# Patient Record
Sex: Female | Born: 1980 | Race: White | Hispanic: No | Marital: Married | Smoking: Current every day smoker
Health system: Southern US, Community
[De-identification: ages and names within clinical notes are randomized; demographics above are authoritative.]

## PROBLEM LIST (undated history)

## (undated) ENCOUNTER — Inpatient Hospital Stay (HOSPITAL_COMMUNITY): Payer: Self-pay

## (undated) DIAGNOSIS — R112 Nausea with vomiting, unspecified: Secondary | ICD-10-CM

## (undated) DIAGNOSIS — Z62810 Personal history of physical and sexual abuse in childhood: Secondary | ICD-10-CM

## (undated) DIAGNOSIS — F329 Major depressive disorder, single episode, unspecified: Secondary | ICD-10-CM

## (undated) DIAGNOSIS — Z9889 Other specified postprocedural states: Secondary | ICD-10-CM

## (undated) DIAGNOSIS — F17200 Nicotine dependence, unspecified, uncomplicated: Secondary | ICD-10-CM

## (undated) DIAGNOSIS — Z8719 Personal history of other diseases of the digestive system: Secondary | ICD-10-CM

## (undated) DIAGNOSIS — F32A Depression, unspecified: Secondary | ICD-10-CM

## (undated) DIAGNOSIS — F419 Anxiety disorder, unspecified: Secondary | ICD-10-CM

## (undated) DIAGNOSIS — K5090 Crohn's disease, unspecified, without complications: Secondary | ICD-10-CM

## (undated) DIAGNOSIS — K589 Irritable bowel syndrome without diarrhea: Secondary | ICD-10-CM

## (undated) DIAGNOSIS — K219 Gastro-esophageal reflux disease without esophagitis: Secondary | ICD-10-CM

## (undated) HISTORY — PX: UPPER GI ENDOSCOPY: SHX6162

## (undated) HISTORY — PX: DIAGNOSTIC LAPAROSCOPY WITH REMOVAL OF ECTOPIC PREGNANCY: SHX6449

## (undated) HISTORY — PX: COLONOSCOPY: SHX174

## (undated) HISTORY — PX: TONSILLECTOMY: SUR1361

---

## 2010-04-30 ENCOUNTER — Emergency Department (HOSPITAL_BASED_OUTPATIENT_CLINIC_OR_DEPARTMENT_OTHER): Admission: EM | Admit: 2010-04-30 | Discharge: 2010-04-30 | Payer: Self-pay | Admitting: Emergency Medicine

## 2010-04-30 ENCOUNTER — Ambulatory Visit: Payer: Self-pay | Admitting: Radiology

## 2010-05-22 ENCOUNTER — Emergency Department (HOSPITAL_COMMUNITY): Admission: EM | Admit: 2010-05-22 | Discharge: 2010-05-22 | Payer: Self-pay | Admitting: Emergency Medicine

## 2010-05-22 ENCOUNTER — Encounter: Payer: Self-pay | Admitting: Orthopedic Surgery

## 2010-05-27 ENCOUNTER — Ambulatory Visit: Payer: Self-pay | Admitting: Orthopedic Surgery

## 2010-05-27 DIAGNOSIS — S9030XA Contusion of unspecified foot, initial encounter: Secondary | ICD-10-CM | POA: Insufficient documentation

## 2010-06-06 ENCOUNTER — Emergency Department (HOSPITAL_BASED_OUTPATIENT_CLINIC_OR_DEPARTMENT_OTHER): Admission: EM | Admit: 2010-06-06 | Discharge: 2010-06-06 | Payer: Self-pay | Admitting: Emergency Medicine

## 2010-06-07 ENCOUNTER — Emergency Department (HOSPITAL_COMMUNITY): Admission: EM | Admit: 2010-06-07 | Discharge: 2010-06-07 | Payer: Self-pay | Admitting: Emergency Medicine

## 2010-07-15 ENCOUNTER — Encounter: Payer: Self-pay | Admitting: Orthopedic Surgery

## 2010-09-16 ENCOUNTER — Ambulatory Visit: Payer: Self-pay | Admitting: Obstetrics and Gynecology

## 2010-09-16 ENCOUNTER — Inpatient Hospital Stay (HOSPITAL_COMMUNITY): Admission: AD | Admit: 2010-09-16 | Discharge: 2010-09-16 | Payer: Self-pay | Admitting: Obstetrics & Gynecology

## 2011-01-21 NOTE — Letter (Signed)
Summary: *Orthopedic No Show Letter  [facility]  [address]  [place], [place] [address]   Phone: [phone]  Fax: [phone]     07/15/2010   Hephzibah Strehle [address] [place], [place]   [address]    Dear Ms. CASEY,   Our records indicate that you missed your scheduled appointment with Dr. Beaulah Corin on _7/21/2011_.  Please contact this office to reschedule your appointment as soon as possible.  It is important that you keep your scheduled appointments with your physician, so we can provide you the best care possible.        Sincerely,    Dr. Terrance Mass, MD [facility] Phone [phone]

## 2011-01-21 NOTE — Letter (Signed)
Summary: History form  History form   Imported By: Jacklynn Ganong 05/29/2010 09:07:57  _____________________________________________________________________  External Attachment:    Type:   Image     Comment:   External Document

## 2011-01-21 NOTE — Assessment & Plan Note (Signed)
Summary: [facility] ER FOL/UP/RT HEEL INJURY/XRAY THERE 05/22/10/[facility]   Vital Signs:  Patient profile:   30 year old female Height:      63 inches Weight:      121 pounds Pulse rate:   70 / minute Resp:     16 per minute  Vitals Entered By: Fuller Canada MD (May 27, 2010 1:49 PM)  Visit Type:  new patient Referring Provider:  ap er Primary Provider:  na  CC:  right foot pain.  History of Present Illness: I saw Mary Lowe in the office today for an initial visit.  She is a 30 years old woman with the complaint of:  right foot pain.  The patient reports jumping into a shallow pulley of on May 30 complaint of pain in her RIGHT heel presented to the emergency room according to the notes 2 days later with no bruising or ecchymosis or swelling just tenderness in the heel and difficulty weightbearing  Complaint of sharp throbbing 9/10 constant pain worse with walking and putting on her shoes  X-rays are negative  She is on Percocet and Reglan complains of nausea; Reglan is not working, also taking ibuprofen 800 mg but appears to be taking the Percocet every 6-8 hours and alternating with ibuprofen.        Allergies (verified): No Known Drug Allergies  Past History:  Past Medical History: na  Past Surgical History: na  Family History: FH of Cancer:   Social History: Patient is divorced.  unemployed smokes 1/2 ppd no alcohol no caffeine GED  Review of Systems Constitutional:  Complains of chills and fatigue; denies weight loss, weight gain, and fever. Cardiovascular:  Complains of fainting; denies chest pain, palpitations, and murmurs. Respiratory:  Denies short of breath, wheezing, couch, tightness, pain on inspiration, and snoring . Gastrointestinal:  Complains of nausea and vomiting; denies heartburn, diarrhea, constipation, and blood in your stools. Genitourinary:  Denies frequency, urgency, difficulty urinating, painful urination, flank pain, and bleeding in  urine. Neurologic:  Complains of tingling and dizziness; denies numbness, unsteady gait, tremors, and seizure. Musculoskeletal:  Denies joint pain, swelling, instability, stiffness, redness, heat, and muscle pain. Endocrine:  Complains of excessive thirst; denies exessive urination and heat or cold intolerance. Psychiatric:  Denies nervousness, depression, anxiety, and hallucinations. Skin:  Denies changes in the skin, poor healing, rash, itching, and redness. HEENT:  Denies blurred or double vision, eye pain, redness, and watering. Immunology:  Denies seasonal allergies, sinus problems, and allergic to bee stings. Hemoatologic:  Denies easy bleeding and brusing.  Physical Exam  Skin:  intact without lesions or rashes Inguinal Nodes:  no significant adenopathy Psych:  alert and cooperative; normal mood and affect; normal attention span and concentration   Foot/Ankle Exam  General:    Well-developed, well-nourished ,normal body habitus; no deformities, normal grooming.    Gait:    antalgic.    Skin:    Intact with no scars, lesions, rashes, cafe-au-lait spots or bruising.    Inspection:    Inspection reveals no deformity, ecchymosis or swelling.   Palpation:    over the base of the heeltenderness R-hindfoot:   Vascular:    dorsalis pedis and posterior tibial pulses 2+ and symmetric, capillary refill < 2 seconds, normal hair pattern, no evidence of ischemia.   Sensory:    gross sensation intact bilaterally in lower extremities.    Motor:    Motor strength 5/5 bilaterally for ankle dorsiflexion, ankle plantar flexion, ankle inversion and ankle eversion.  Her Mary Lowe test is negative for Achilles tendon rupture  Reflexes:    Normal and symmetric Achilles reflexes bilaterally.    Ankle Exam:    Right:    Inspection:  Normal    Palpation:  Normal    she does not have any bruising or ecchymosis questionable secondary gain    Left:    Inspection:  Normal     Palpation:  Normal   Impression & Recommendations:  Problem # 1:  CONTUSION, RIGHT FOOT (ICD-924.20) Assessment New  RIGHT contusion no fracture seen on x-ray  Orders: New Patient Level III ([address])  Medications Added to Medication List This Visit: 1)  Promethazine Hcl 25 Mg Tabs (Promethazine hcl) .Marland Kitchen.. 1 q 4 hrs 2)  Percocet 5-325 Mg Tabs (Oxycodone-acetaminophen) .Marland Kitchen.. 1 q 4 prn pain  Patient Instructions: 1)  Stay off your feet use the crutches until the pain improves  2)  Take percocet 5 mg q 4 hours  3)  Take Ibuprofen q 8 hrs 4)  recheck in 4 weeks   Prescriptions: PERCOCET 5-325 MG TABS (OXYCODONE-ACETAMINOPHEN) 1 q 4 prn pain  #84 x 0   Entered and Authorized by:   Fuller Canada MD   Signed by:   Fuller Canada MD on 05/27/2010   Method used:   Print then Give to Patient   RxID:   [id number] PROMETHAZINE HCL 25 MG TABS (PROMETHAZINE HCL) 1 q 4 hrs  #84 x 2   Entered and Authorized by:   Fuller Canada MD   Signed by:   Fuller Canada MD on 05/27/2010   Method used:   Print then Give to Patient   RxID:   [id number]

## 2011-03-06 LAB — GC/CHLAMYDIA PROBE AMP, GENITAL
Chlamydia, DNA Probe: NEGATIVE
GC Probe Amp, Genital: NEGATIVE

## 2011-03-06 LAB — URINALYSIS, ROUTINE W REFLEX MICROSCOPIC
Hgb urine dipstick: NEGATIVE
Protein, ur: NEGATIVE mg/dL
Urobilinogen, UA: 0.2 mg/dL (ref 0.0–1.0)

## 2011-03-06 LAB — CBC
RBC: 4.33 MIL/uL (ref 3.87–5.11)
RDW: 13.5 % (ref 11.5–15.5)

## 2011-03-06 LAB — WET PREP, GENITAL
Clue Cells Wet Prep HPF POC: NONE SEEN
Trich, Wet Prep: NONE SEEN

## 2011-03-06 LAB — POCT PREGNANCY, URINE: Preg Test, Ur: POSITIVE

## 2011-03-09 LAB — URINALYSIS, ROUTINE W REFLEX MICROSCOPIC
Bilirubin Urine: NEGATIVE
Glucose, UA: NEGATIVE mg/dL
Protein, ur: NEGATIVE mg/dL
Specific Gravity, Urine: 1.024 (ref 1.005–1.030)

## 2011-03-09 LAB — PREGNANCY, URINE: Preg Test, Ur: NEGATIVE

## 2011-03-11 LAB — COMPREHENSIVE METABOLIC PANEL
ALT: 16 U/L (ref 0–35)
BUN: 12 mg/dL (ref 6–23)
Calcium: 9.3 mg/dL (ref 8.4–10.5)
Chloride: 108 mEq/L (ref 96–112)
GFR calc Af Amer: >60 mL/min (ref >60)
Potassium: 4.4 mEq/L (ref 3.5–5.1)
Sodium: 144 mEq/L (ref 135–145)

## 2011-03-11 LAB — CBC
HCT: 40.7 % (ref 36.0–46.0)
MCHC: 33.9 g/dL (ref 30.0–36.0)
MCV: 84.1 fL (ref 78.0–100.0)
Platelets: 220 10*3/uL (ref 150–400)
RBC: 4.84 MIL/uL (ref 3.87–5.11)
RDW: 14.8 % (ref 11.5–15.5)
WBC: 9 10*3/uL (ref 4.0–10.5)

## 2011-03-11 LAB — URINALYSIS, ROUTINE W REFLEX MICROSCOPIC
Hgb urine dipstick: NEGATIVE
Protein, ur: NEGATIVE mg/dL

## 2011-03-11 LAB — DIFFERENTIAL
Basophils Absolute: 0.1 10*3/uL (ref 0.0–0.1)
Eosinophils Absolute: 0.1 10*3/uL (ref 0.0–0.7)
Eosinophils Relative: 1 % (ref 0–5)
Lymphocytes Relative: 22 % (ref 12–46)
Lymphs Abs: 2 10*3/uL (ref 0.7–4.0)

## 2011-07-14 ENCOUNTER — Inpatient Hospital Stay (HOSPITAL_COMMUNITY): Payer: 59

## 2011-07-14 ENCOUNTER — Other Ambulatory Visit: Payer: Self-pay | Admitting: Advanced Practice Midwife

## 2011-07-14 ENCOUNTER — Emergency Department (HOSPITAL_COMMUNITY)
Admission: EM | Admit: 2011-07-14 | Discharge: 2011-07-14 | Disposition: A | Discharge status: Home / Self Care | Payer: 59 | Attending: Emergency Medicine | Admitting: Emergency Medicine

## 2011-07-14 ENCOUNTER — Encounter (HOSPITAL_COMMUNITY): Payer: Self-pay

## 2011-07-14 ENCOUNTER — Emergency Department (HOSPITAL_COMMUNITY): Payer: 59

## 2011-07-14 ENCOUNTER — Inpatient Hospital Stay (EMERGENCY_DEPARTMENT_HOSPITAL)
Admission: AD | Admit: 2011-07-14 | Discharge: 2011-07-14 | Disposition: A | Discharge status: Still In Hospital | Payer: 59 | Source: Ambulatory Visit | Attending: Obstetrics & Gynecology | Admitting: Obstetrics & Gynecology

## 2011-07-14 DIAGNOSIS — Q619 Cystic kidney disease, unspecified: Secondary | ICD-10-CM | POA: Insufficient documentation

## 2011-07-14 DIAGNOSIS — Z3201 Encounter for pregnancy test, result positive: Secondary | ICD-10-CM

## 2011-07-14 DIAGNOSIS — O4690 Antepartum hemorrhage, unspecified, unspecified trimester: Secondary | ICD-10-CM

## 2011-07-14 DIAGNOSIS — O99330 Smoking (tobacco) complicating pregnancy, unspecified trimester: Secondary | ICD-10-CM | POA: Insufficient documentation

## 2011-07-14 DIAGNOSIS — R1031 Right lower quadrant pain: Secondary | ICD-10-CM | POA: Insufficient documentation

## 2011-07-14 DIAGNOSIS — K589 Irritable bowel syndrome without diarrhea: Secondary | ICD-10-CM | POA: Insufficient documentation

## 2011-07-14 DIAGNOSIS — O26899 Other specified pregnancy related conditions, unspecified trimester: Secondary | ICD-10-CM | POA: Diagnosis present

## 2011-07-14 DIAGNOSIS — R109 Unspecified abdominal pain: Secondary | ICD-10-CM | POA: Diagnosis present

## 2011-07-14 DIAGNOSIS — O2690 Pregnancy related conditions, unspecified, unspecified trimester: Secondary | ICD-10-CM | POA: Insufficient documentation

## 2011-07-14 DIAGNOSIS — R51 Headache: Secondary | ICD-10-CM | POA: Insufficient documentation

## 2011-07-14 DIAGNOSIS — O34519 Maternal care for incarceration of gravid uterus, unspecified trimester: Secondary | ICD-10-CM | POA: Insufficient documentation

## 2011-07-14 DIAGNOSIS — O9989 Other specified diseases and conditions complicating pregnancy, childbirth and the puerperium: Secondary | ICD-10-CM

## 2011-07-14 LAB — URINALYSIS, ROUTINE W REFLEX MICROSCOPIC
Hgb urine dipstick: NEGATIVE
Ketones, ur: NEGATIVE mg/dL
Leukocytes, UA: NEGATIVE
Protein, ur: NEGATIVE mg/dL
Urobilinogen, UA: 0.2 mg/dL (ref 0.0–1.0)
pH: 5.5 (ref 5.0–8.0)

## 2011-07-14 LAB — HCG, QUANTITATIVE, PREGNANCY: hCG, Beta Chain, Quant, S: 661 m[IU]/mL — ABNORMAL HIGH (ref <5)

## 2011-07-14 LAB — CBC
Hemoglobin: 13.8 g/dL (ref 12.0–15.0)
MCH: 29.7 pg (ref 26.0–34.0)
WBC: 10.7 10*3/uL — ABNORMAL HIGH (ref 4.0–10.5)

## 2011-07-14 LAB — ABO/RH: ABO/RH(D): A NEG

## 2011-07-14 MED ORDER — PROMETHAZINE HCL 25 MG PO TABS
25.0000 mg | ORAL_TABLET | Freq: Once | ORAL | Status: AC
  Administered 2011-07-14: 25 mg via ORAL
  Filled 2011-07-14: qty 1

## 2011-07-14 MED ORDER — HYDROMORPHONE HCL 1 MG/ML IJ SOLN
2.0000 mg | Freq: Once | INTRAMUSCULAR | Status: AC
  Administered 2011-07-14: 2 mg via INTRAVENOUS
  Filled 2011-07-14: qty 2

## 2011-07-14 MED ORDER — ONDANSETRON 8 MG PO TBDP: 8.0000 mg | ORAL_TABLET | Freq: Once | ORAL | Status: DC

## 2011-07-14 MED ORDER — ONDANSETRON HCL 4 MG PO TABS
8.0000 mg | ORAL_TABLET | Freq: Once | ORAL | Status: AC
  Administered 2011-07-14: 8 mg via ORAL
  Filled 2011-07-14: qty 2

## 2011-07-14 MED ORDER — OXYCODONE-ACETAMINOPHEN 5-325 MG PO TABS
2.0000 | ORAL_TABLET | Freq: Once | ORAL | Status: AC
  Administered 2011-07-14: 2 via ORAL
  Filled 2011-07-14: qty 2

## 2011-07-14 MED ORDER — SODIUM CHLORIDE 0.9 % IV SOLN
INTRAVENOUS | Status: DC
  Administered 2011-07-14: 18:00:00 via INTRAVENOUS

## 2011-07-14 NOTE — Progress Notes (Signed)
Pt in c/o pain in lower right quadrant sharp pains x3 days.  States pain is constant and has gotten worse since pelvic exam performed last night.  Was seen at [facility]. Center, had BHCG- 487, had wet prep/gc/chlamydia, and u/s- nothing seen on u/s.  LMP was 05/09/11.  Denies any bleeding.  Reports heavy bleeding 2 weeks during/after intercourse- had a lot of cramping with bleeding- states she did pass clots.

## 2011-07-14 NOTE — ED Provider Notes (Signed)
History   The pt is a 30 year-old G4P2012 at 9.3 weeks by LMP who presents to MAU reporting severe RLQ pain. She was seen at [facility] last night for the same problem. Quant hCG was 487. US showed no GS or adnexal masses. The pt experiences and increase in pain since then and came to MAU for further eval. She had one episode of bleeding two weeks ago post-IC w/ no bleeding since, but did not seek care at that time. She is Rh neg and did not receive Rhophylac.  Chief Complaint  Patient presents with  . Abdominal Pain   Abdominal Pain This is a new problem. The current episode started yesterday. The onset quality is gradual. The problem occurs constantly. The most recent episode lasted 2 days. The problem has been gradually worsening. The pain is located in the RLQ. The pain is at a severity of 10/10. The pain is severe. The quality of the pain is sharp. The abdominal pain does not radiate. Associated symptoms include headaches and nausea. Pertinent negatives include no constipation, diarrhea, dysuria, frequency, hematuria, myalgias or vomiting.    OB History    Grav Para Term Preterm Abortions TAB SAB Ect Mult Living   4 2 2  0 1 0 1 0 0 2      Past Medical History  Diagnosis Date  . No pertinent past medical history     Past Surgical History  Procedure Date  . No past surgeries     No family history on file.  History  Substance Use Topics  . Smoking status: Current Everyday Smoker  . Smokeless tobacco: Not on file  . Alcohol Use: No    Allergies:  Allergies  Allergen Reactions  . Sulfa Antibiotics Hives    Prescriptions prior to admission  Medication Sig Dispense Refill  . acetaminophen (TYLENOL) 325 MG tablet Take 650 mg by mouth every 6 (six) hours as needed. PATIENT TAKES FOR PAIN      . famotidine ([employer] ACID REDUCER) 10 MG tablet Take 10 mg by mouth daily as needed. PATIENT TAKES FOR ACID REFLUX       . prenatal vitamin w/FE, FA (PRENATAL 1 + 1) 27-1 MG TABS Take 1  tablet by mouth daily.          Review of Systems  Constitutional: Negative.        Loss of appetite since this morning  Gastrointestinal: Positive for nausea and abdominal pain. Negative for vomiting, diarrhea, constipation and blood in stool.  Genitourinary: Negative for dysuria, urgency, frequency, hematuria and flank pain.  Musculoskeletal: Negative for myalgias.  Neurological: Positive for headaches.  All other systems reviewed and are negative.   Physical Exam   Results for orders placed during the hospital encounter of 07/14/11 (from the past 24 hour(s))  PREGNANCY, URINE     Status: Normal   Collection Time   07/14/11 12:13 PM      Component Value Range   Preg Test, Ur POSITIVE    URINALYSIS, ROUTINE W REFLEX MICROSCOPIC     Status: Normal   Collection Time   07/14/11 12:13 PM      Component Value Range   Color, Urine YELLOW  YELLOW    Appearance CLEAR  CLEAR    Specific Gravity, Urine 1.025  1.005 - 1.030    pH 5.5  5.0 - 8.0    Glucose, UA NEGATIVE  NEGATIVE (mg/dL)   Hgb urine dipstick NEGATIVE  NEGATIVE    Bilirubin Urine  NEGATIVE  NEGATIVE    Ketones, ur NEGATIVE  NEGATIVE (mg/dL)   Protein, ur NEGATIVE  NEGATIVE (mg/dL)   Urobilinogen, UA 0.2  0.0 - 1.0 (mg/dL)   Nitrite NEGATIVE  NEGATIVE    Leukocytes, UA NEGATIVE  NEGATIVE   POCT PREGNANCY, URINE     Status: Normal   Collection Time   07/14/11 12:22 PM      Component Value Range   Preg Test, Ur POSITIVE    HCG, QUANTITATIVE, PREGNANCY     Status: Abnormal   Collection Time   07/14/11 12:48 PM      Component Value Range   hCG, Beta Chain, Quant, S 661 (*) <5 (mIU/mL)  CBC     Status: Abnormal   Collection Time   07/14/11 12:48 PM      Component Value Range   WBC 10.7 (*) 4.0 - 10.5 (K/uL)   RBC 4.64  3.87 - 5.11 (MIL/uL)   Hemoglobin 13.8  12.0 - 15.0 (g/dL)   HCT 04.5  40.9 - 81.1 (%)   MCV 87.7  78.0 - 100.0 (fL)   MCH 29.7  26.0 - 34.0 (pg)   MCHC 33.9  30.0 - 36.0 (g/dL)   RDW 91.4  78.2 - 95.6  (%)   Platelets 180  150 - 400 (K/uL)  ABO/RH     Status: Normal   Collection Time   07/14/11 12:48 PM      Component Value Range   ABO/RH(D) A NEG      Blood pressure 121/70, pulse 93, temperature 99.1 F (37.3 C), temperature source Oral, resp. rate 16, height 5\' 3"  (1.6 m), weight 60.895 kg (134 lb 4 oz), last menstrual period 05/09/2011.  Physical Exam  Constitutional: She is oriented to person, place, and time. She appears well-developed and well-nourished. She appears distressed.  Cardiovascular: Normal rate and regular rhythm.   Respiratory: Effort normal.  GI: Soft. Bowel sounds are normal. She exhibits no mass. There is tenderness (RLQ). There is rebound (RLQ) and guarding (RLQ).  Genitourinary: Vagina normal and uterus normal. No vaginal discharge found.  Musculoskeletal: Normal range of motion.  Neurological: She is alert and oriented to person, place, and time.  Skin: Skin is warm and dry. She is not diaphoretic.  Psychiatric: She has a normal mood and affect.    MAU Course  Procedures  Assessment and Plan  Assessment: 1. R/O appendicitis vs ectopic 2. Early pregnancy, rising quants, but interval too short to determine is rising appropriately  Plan: 1. Per consult w/ Dr. Debroah Loop abd and pelvis US   Aniko Finnigan 07/14/2011, 5:13 PM

## 2011-07-14 NOTE — ED Provider Notes (Signed)
*  RADIOLOGY REPORT*  Clinical Data: Positive pregnancy test, right lower quadrant  abdominal pain. Clinical concern for appendicitis.  LIMITED ABDOMINAL ULTRASOUND  Comparison: None.  Findings: Evaluation of the right lower quadrant does not  demonstrate an anatomic structure identifiable as the appendix.  IMPRESSION:  Appendix not identified. If there is high clinical suspicion for  appendicitis, MRI of the abdomen and pelvis without contrast would  be recommended for further evaluation.  Original Report Authenticated By: Harrel Lemon, M.D.  OB US <14 weeks No GS, YS, FP or cardiac activity Left simple cysts2.3 x 1.7x 2.1, 1.8x0.9x 1.3 cm   Per consult w/ Dr. Debroah Loop transfer to [facility] for MRI w/out contrast. IV Dilaudid 2 mg and Phenergan

## 2011-07-14 NOTE — Progress Notes (Signed)
Pt states had +upt last week at home, went to ED in [place] last pm, told she has cyst on her ovary, and hcg level was 487. LMP-05/09/2011, denies vag d/c changes or bleeding. GC/CHL, wet prep completed at ED last pm. Lower abd pain into her vagina, rates 9-10, has worsened in past 2-3 days, extremely worse post MD exam.

## 2011-07-15 ENCOUNTER — Telehealth: Payer: Self-pay | Admitting: Advanced Practice Midwife

## 2011-07-16 ENCOUNTER — Ambulatory Visit (HOSPITAL_COMMUNITY): Payer: 59

## 2011-07-18 ENCOUNTER — Inpatient Hospital Stay (HOSPITAL_COMMUNITY): Payer: 59

## 2011-07-18 ENCOUNTER — Other Ambulatory Visit (HOSPITAL_COMMUNITY): Payer: Self-pay | Admitting: *Deleted

## 2011-07-18 ENCOUNTER — Other Ambulatory Visit: Payer: 59

## 2011-07-18 ENCOUNTER — Inpatient Hospital Stay (HOSPITAL_COMMUNITY)
Admission: AD | Admit: 2011-07-18 | Discharge: 2011-07-18 | Disposition: A | Discharge status: Home / Self Care | Payer: 59 | Source: Ambulatory Visit | Attending: Obstetrics & Gynecology | Admitting: Obstetrics & Gynecology

## 2011-07-18 ENCOUNTER — Encounter (HOSPITAL_COMMUNITY): Payer: Self-pay | Admitting: *Deleted

## 2011-07-18 DIAGNOSIS — O26899 Other specified pregnancy related conditions, unspecified trimester: Secondary | ICD-10-CM

## 2011-07-18 DIAGNOSIS — O9989 Other specified diseases and conditions complicating pregnancy, childbirth and the puerperium: Secondary | ICD-10-CM | POA: Insufficient documentation

## 2011-07-18 DIAGNOSIS — Z1389 Encounter for screening for other disorder: Secondary | ICD-10-CM | POA: Insufficient documentation

## 2011-07-18 DIAGNOSIS — Z3490 Encounter for supervision of normal pregnancy, unspecified, unspecified trimester: Secondary | ICD-10-CM

## 2011-07-18 DIAGNOSIS — R109 Unspecified abdominal pain: Secondary | ICD-10-CM

## 2011-07-18 DIAGNOSIS — Z363 Encounter for antenatal screening for malformations: Secondary | ICD-10-CM | POA: Insufficient documentation

## 2011-07-18 DIAGNOSIS — Z8719 Personal history of other diseases of the digestive system: Secondary | ICD-10-CM | POA: Insufficient documentation

## 2011-07-18 LAB — HCG, QUANTITATIVE, PREGNANCY: hCG, Beta Chain, Quant, S: 2818 m[IU]/mL — ABNORMAL HIGH (ref <5)

## 2011-07-18 NOTE — Progress Notes (Signed)
Mary Lowe notified of [place] needing to be transvaginal per [place].  Orders received for transvaginal US.

## 2011-07-18 NOTE — Progress Notes (Signed)
K. Shaw, CNM at bedside.  Assessment done and poc discussed with pt.  

## 2011-07-18 NOTE — Progress Notes (Signed)
Pincus Badder, cnm notified of quant result.  Will be up to see pt.

## 2011-07-18 NOTE — Progress Notes (Signed)
Pt states she has ibs.  States she normally goes to the bathroom 5 times a day.  She hasn't had a bowel movement since Sunday.

## 2011-07-18 NOTE — Progress Notes (Signed)
Pt presents to mau with c/o lower abdominal pain.  States it hurts to walk.  Radiates from right all the way to left and is worse in the middle.

## 2011-07-18 NOTE — Progress Notes (Signed)
Pt reports she was seen at [facility] hospital on Monday , positive preg test, had u/s and MRI. MRI was negative. HCG was 600+. Pt states the pain is getting worse and it hurts to walk. Denies bleeding.

## 2011-07-18 NOTE — ED Provider Notes (Signed)
History   Pt presents for eval of continued low abd pain and for follow up of quant hcg. She was seen on 7/23 with quant of 600 and was also eval by [facility] and appendicitis was ruled out. She hasn't had a BM since 7/22 and has a hx of IBS with freq BMs.   Chief Complaint  Patient presents with  . Abdominal Pain   HPI  OB History    Grav Para Term Preterm Abortions TAB SAB Ect Mult Living   4 2 2  0 1 0 1 0 0 2      Past Medical History  Diagnosis Date  . No pertinent past medical history     Past Surgical History  Procedure Date  . No past surgeries     No family history on file.  History  Substance Use Topics  . Smoking status: Current Everyday Smoker  . Smokeless tobacco: Not on file  . Alcohol Use: No    Allergies:  Allergies  Allergen Reactions  . Sulfa Antibiotics Hives    Prescriptions prior to admission  Medication Sig Dispense Refill  . metoCLOPramide (REGLAN) 10 MG tablet Take 10 mg by mouth every 4 (four) hours as needed. nausea       . oxyCODONE-acetaminophen (PERCOCET) 5-325 MG per tablet Take 1 tablet by mouth every 6 (six) hours as needed. pain       . prenatal vitamin w/FE, FA (PRENATAL 1 + 1) 27-1 MG TABS Take 1 tablet by mouth daily.          Review of Systems  Constitutional: Negative for fever.   Physical Exam   Blood pressure 126/79, pulse 93, temperature 98.4 F (36.9 C), temperature source Oral, resp. rate 18, height 5\' 3"  (1.6 m), weight 62.143 kg (137 lb), last menstrual period 05/09/2011.  Physical Exam  Constitutional: She is oriented to person, place, and time. She appears well-developed.  GI: Soft. There is tenderness. There is no rebound and no guarding.  Neurological: She is alert and oriented to person, place, and time.  Skin: Skin is warm and dry.  Psychiatric: She has a normal mood and affect.    MAU Course  Procedures  MDM Quant 2818; US shows [redacted]w[redacted]d IUS +YS, no cardiac After [place], pt passed gas and now feels much of abd  pain has resolved.  Assessment and Plan  IUP at [redacted]w[redacted]d IBS with constipation/gas  Rec seek prenatal care as soon as possible. Rec use Miralax OTC as directed for constipation.   SHAW,KIMBERLY B 07/18/2011, 2:56 AM

## 2011-08-18 LAB — ANTIBODY SCREEN: Antibody Screen: NEGATIVE

## 2011-08-18 LAB — ABO/RH: RH Type: NEGATIVE

## 2011-08-18 LAB — GC/CHLAMYDIA PROBE AMP, GENITAL
Chlamydia: NEGATIVE
Gonorrhea: NEGATIVE

## 2011-08-18 LAB — HIV ANTIBODY (ROUTINE TESTING W REFLEX): HIV: NONREACTIVE

## 2011-09-24 DIAGNOSIS — O479 False labor, unspecified: Secondary | ICD-10-CM

## 2011-09-24 DIAGNOSIS — R1084 Generalized abdominal pain: Secondary | ICD-10-CM

## 2011-09-24 DIAGNOSIS — N898 Other specified noninflammatory disorders of vagina: Secondary | ICD-10-CM

## 2011-10-31 NOTE — Telephone Encounter (Signed)
Error

## 2011-12-05 ENCOUNTER — Inpatient Hospital Stay (HOSPITAL_COMMUNITY)
Admission: AD | Admit: 2011-12-05 | Discharge: 2011-12-05 | Disposition: A | Discharge status: Home / Self Care | Payer: Medicaid Other | Source: Ambulatory Visit | Attending: Obstetrics and Gynecology | Admitting: Obstetrics and Gynecology

## 2011-12-05 ENCOUNTER — Encounter (HOSPITAL_COMMUNITY): Payer: Self-pay | Admitting: *Deleted

## 2011-12-05 DIAGNOSIS — N898 Other specified noninflammatory disorders of vagina: Secondary | ICD-10-CM | POA: Insufficient documentation

## 2011-12-05 DIAGNOSIS — O99891 Other specified diseases and conditions complicating pregnancy: Secondary | ICD-10-CM | POA: Insufficient documentation

## 2011-12-05 HISTORY — DX: Irritable bowel syndrome, unspecified: K58.9

## 2011-12-05 LAB — WET PREP, GENITAL: Yeast Wet Prep HPF POC: NONE SEEN

## 2011-12-05 LAB — POCT FERN TEST: Fern Test: NEGATIVE

## 2011-12-05 NOTE — ED Provider Notes (Signed)
History     Chief Complaint  Patient presents with  . Vaginal Discharge   HPI  Pt is here with report of leaking of fluid at 1600, clear fluid.  +cramping since Sunday, 11/30/11.  Denies vaginal bleeding.  +fetal movement.    Past Medical History  Diagnosis Date  . No pertinent past medical history   . IBS (irritable bowel syndrome)     Past Surgical History  Procedure Date  . No past surgeries     Family History  Problem Relation Age of Onset  . Depression Mother   . Depression Father   . Heart disease Father     History  Substance Use Topics  . Smoking status: Current Everyday Smoker  . Smokeless tobacco: Not on file  . Alcohol Use: No    Allergies:  Allergies  Allergen Reactions  . Sulfa Antibiotics Hives    Prescriptions prior to admission  Medication Sig Dispense Refill  . metoCLOPramide (REGLAN) 10 MG tablet Take 10 mg by mouth every 4 (four) hours as needed. nausea       . oxyCODONE-acetaminophen (PERCOCET) 5-325 MG per tablet Take 1 tablet by mouth every 6 (six) hours as needed. pain       . prenatal vitamin w/FE, FA (PRENATAL 1 + 1) 27-1 MG TABS Take 1 tablet by mouth daily.          Review of Systems  Gastrointestinal: Positive for abdominal pain (cramping).  Genitourinary:       Vaginal discharge    Physical Exam   Temperature 97.3 F (36.3 C), height 5\' 3"  (1.6 m), weight 68.04 kg (150 lb), last menstrual period 05/09/2011.  Physical Exam  Constitutional: She is oriented to person, place, and time. She appears well-developed and well-nourished.  HENT:  Head: Normocephalic.  Neck: Normal range of motion. Neck supple.  Cardiovascular: Normal rate, regular rhythm and normal heart sounds.   Respiratory: Effort normal and breath sounds normal.  Genitourinary: No bleeding around the vagina. Vaginal discharge (mucusy) found.  Neurological: She is alert and oriented to person, place, and time.  Skin: Skin is warm and dry.  Fern - negative;  cervix closed FHR 150's, 10x10's Toco - none  Fern - negative after waiting one hour MAU Course  Procedures  Results for orders placed during the hospital encounter of 12/05/11 (from the past 24 hour(s))  WET PREP, GENITAL     Status: Abnormal   Collection Time   12/05/11  7:25 PM      Component Value Range   Yeast, Wet Prep NONE SEEN  NONE SEEN    Trich, Wet Prep NONE SEEN  NONE SEEN    Clue Cells, Wet Prep NONE SEEN  NONE SEEN    WBC, Wet Prep HPF POC FEW (*) NONE SEEN   POCT FERN TEST     Status: Normal   Collection Time   12/05/11  7:28 PM      Component Value Range   Fern Test Negative       Assessment and Plan  Vaginal Discharge  Plan: DC to  Home Reviewed labor precautions  [facility] 12/05/2011, 7:18 PM

## 2011-12-05 NOTE — Progress Notes (Signed)
Possible leaking since Monday, today gush fluid then mucus, cramps since Sunday after 16 hr shift

## 2011-12-05 NOTE — Progress Notes (Signed)
Threasa Heads CNM in to see pt. Spec exam done and fern slide and wet prep obtained. Pt tol well.

## 2011-12-05 NOTE — Progress Notes (Signed)
Written and verbal d/c instructions given and understanding voiced. 

## 2011-12-05 NOTE — Progress Notes (Signed)
Last of appetite, stressful work, heavy lifting

## 2011-12-05 NOTE — ED Notes (Signed)
86WTanja Port CNM in to see pt. Fern slide obtained.

## 2011-12-05 NOTE — Consult Note (Signed)
Consulted with Dr. Ellyn Hack, reviewed HPI, exam, fern neg>send wet prep and recheck in an hour.

## 2011-12-23 NOTE — L&D Delivery Note (Signed)
Delivery Note At 6:43 PM a healthy female was delivered via Vaginal, Spontaneous Delivery (Presentation: ; Occiput Anterior).  APGAR: 9, 9; weight 8 lb 10.6 oz (3929 g).   Placenta status: Intact, Spontaneous.  Anesthesia: Epidural  Episiotomy: None Lacerations: 1st degree Suture Repair: 3.0 vicryl rapide Est. Blood Loss (mL): 350cc  Mom to postpartum.  Baby to nursery-stable.  Mary Lowe 03/12/2012, 6:57 PM

## 2012-01-30 ENCOUNTER — Encounter (HOSPITAL_COMMUNITY): Payer: Self-pay | Admitting: *Deleted

## 2012-01-30 ENCOUNTER — Inpatient Hospital Stay (HOSPITAL_COMMUNITY)
Admission: AD | Admit: 2012-01-30 | Discharge: 2012-01-30 | Disposition: A | Discharge status: Home / Self Care | Payer: Medicaid Other | Source: Ambulatory Visit | Attending: Obstetrics and Gynecology | Admitting: Obstetrics and Gynecology

## 2012-01-30 DIAGNOSIS — O4700 False labor before 37 completed weeks of gestation, unspecified trimester: Secondary | ICD-10-CM | POA: Insufficient documentation

## 2012-01-30 DIAGNOSIS — O479 False labor, unspecified: Secondary | ICD-10-CM

## 2012-01-30 LAB — URINALYSIS, ROUTINE W REFLEX MICROSCOPIC
Bilirubin Urine: NEGATIVE
Glucose, UA: NEGATIVE mg/dL
Hgb urine dipstick: NEGATIVE
Ketones, ur: 15 mg/dL — AB
pH: 6 (ref 5.0–8.0)

## 2012-01-30 NOTE — ED Provider Notes (Signed)
History     Chief Complaint  Patient presents with  . Contractions   HPI This is a 31 y.o. G4 P2012 at [redacted]w[redacted]d who presents with c/o UCs off and on all day today. Was seen in office by Dr Ellyn Hack today. Denies leaking or bleeding but did pass some mucous. Feels like baby has dropped. Reports + FM. OB History    Grav Para Term Preterm Abortions TAB SAB Ect Mult Living   4 2 2  0 1 0 1 0 0 2      Past Medical History  Diagnosis Date  . No pertinent past medical history   . IBS (irritable bowel syndrome)     Past Surgical History  Procedure Date  . No past surgeries     Family History  Problem Relation Age of Onset  . Depression Mother   . Depression Father   . Heart disease Father   . Anesthesia problems Neg Hx   . Hypotension Neg Hx   . Malignant hyperthermia Neg Hx   . Pseudochol deficiency Neg Hx     History  Substance Use Topics  . Smoking status: Current Everyday Smoker  . Smokeless tobacco: Not on file  . Alcohol Use: No    Allergies:  Allergies  Allergen Reactions  . Sulfa Antibiotics Hives    Prescriptions prior to admission  Medication Sig Dispense Refill  . omeprazole (PRILOSEC OTC) 20 MG tablet Take 20 mg by mouth daily.      . ondansetron (ZOFRAN) 8 MG tablet Take 8 mg by mouth every 8 (eight) hours as needed. For nausea not relieved by phenergan      . Prenatal Vit-Fe Fumarate-FA (PRENATAL MULTIVITAMIN) TABS Take 1 tablet by mouth daily.      . promethazine (PHENERGAN) 25 MG tablet Take 12.5 mg by mouth every 6 (six) hours as needed. For nausea        ROS As above  Physical Exam   Blood pressure 101/63, pulse 96, temperature 98.8 F (37.1 C), temperature source Oral, resp. rate 16, height 5\' 3"  (1.6 m), weight 156 lb (70.761 kg), last menstrual period 05/09/2011.  Physical Exam  Constitutional: She is oriented to person, place, and time. She appears well-developed and well-nourished.  HENT:  Head: Normocephalic.  Cardiovascular: Normal  rate.   Respiratory: Effort normal.  GI: Soft. She exhibits no distension and no mass. There is no tenderness. There is no rebound and no guarding.  Genitourinary: Uterus normal. Vaginal discharge (clear mucous, cervix FT-1cm/long/ballot/unsure presenting part) found.  Musculoskeletal: Normal range of motion.  Neurological: She is alert and oriented to person, place, and time.  Skin: Skin is warm and dry.  Psychiatric: She has a normal mood and affect.    MAU Course  Procedures  Assessment and Plan  A:  Preterm contractions without significant cervical change P:  Discussed with Dr Ellyn Hack       PO hydration       FFN sent       [facility] 01/30/2012, 10:06 PM   FFN negative UCs have subsided somewhat, with continued irritability. Discussed with Dr Ellyn Hack. Will d/c home with instructions to continue fluid intake and supportive care Followup in office as scheduled.

## 2012-01-30 NOTE — Progress Notes (Signed)
Marie Williams CNM in to discuss d/c plan with pt. Written and verbal d/c instructions given and understanding voiced. 

## 2012-01-30 NOTE — Progress Notes (Signed)
Pt states, " I have had contractions off and on all day and have a lot of pressure like the baby has dropped. I was sitting in the floor at 7:00 pm and when I stood up I felt something run out and I had a sharpa pain in my lower abdomen. I checked and it was bloody and green like my mucus plug."

## 2012-01-30 NOTE — Progress Notes (Signed)
Wynelle Bourgeois CNM in to see pt. FFN obtained and then sve done. Pt tol well

## 2012-03-03 ENCOUNTER — Telehealth (HOSPITAL_COMMUNITY): Payer: Self-pay | Admitting: *Deleted

## 2012-03-03 ENCOUNTER — Encounter (HOSPITAL_COMMUNITY): Payer: Self-pay | Admitting: *Deleted

## 2012-03-03 NOTE — Telephone Encounter (Signed)
Preadmission screen  

## 2012-03-11 ENCOUNTER — Other Ambulatory Visit: Payer: Self-pay | Admitting: Obstetrics and Gynecology

## 2012-03-12 ENCOUNTER — Encounter (HOSPITAL_COMMUNITY): Payer: Self-pay

## 2012-03-12 ENCOUNTER — Inpatient Hospital Stay (HOSPITAL_COMMUNITY): Payer: Medicaid Other | Admitting: Anesthesiology

## 2012-03-12 ENCOUNTER — Inpatient Hospital Stay (HOSPITAL_COMMUNITY)
Admission: RE | Admit: 2012-03-12 | Discharge: 2012-03-14 | DRG: 775 | Disposition: A | Discharge status: Home / Self Care | Payer: Medicaid Other | Source: Ambulatory Visit | Attending: Obstetrics and Gynecology | Admitting: Obstetrics and Gynecology

## 2012-03-12 ENCOUNTER — Encounter (HOSPITAL_COMMUNITY): Payer: Self-pay | Admitting: Anesthesiology

## 2012-03-12 LAB — CBC
Hemoglobin: 12.3 g/dL (ref 12.0–15.0)
MCH: 29.2 pg (ref 26.0–34.0)
MCV: 87.9 fL (ref 78.0–100.0)
RBC: 4.21 MIL/uL (ref 3.87–5.11)

## 2012-03-12 MED ORDER — FENTANYL 2.5 MCG/ML BUPIVACAINE 1/10 % EPIDURAL INFUSION (WH - ANES)
INTRAMUSCULAR | Status: DC | PRN
  Administered 2012-03-12: 14 mL/h via EPIDURAL

## 2012-03-12 MED ORDER — OXYCODONE-ACETAMINOPHEN 5-325 MG PO TABS: 1.0000 | ORAL_TABLET | ORAL | Status: DC | PRN

## 2012-03-12 MED ORDER — DIPHENHYDRAMINE HCL 25 MG PO CAPS: 25.0000 mg | ORAL_CAPSULE | Freq: Four times a day (QID) | ORAL | Status: DC | PRN

## 2012-03-12 MED ORDER — LACTATED RINGERS IV SOLN
INTRAVENOUS | Status: DC
  Administered 2012-03-12 (×3): via INTRAVENOUS

## 2012-03-12 MED ORDER — DIBUCAINE 1 % RE OINT: 1.0000 "application " | TOPICAL_OINTMENT | RECTAL | Status: DC | PRN

## 2012-03-12 MED ORDER — BUTORPHANOL TARTRATE 2 MG/ML IJ SOLN
1.0000 mg | Freq: Once | INTRAMUSCULAR | Status: AC
  Administered 2012-03-12: 1 mg via INTRAVENOUS
  Filled 2012-03-12: qty 1

## 2012-03-12 MED ORDER — SENNOSIDES-DOCUSATE SODIUM 8.6-50 MG PO TABS
2.0000 | ORAL_TABLET | Freq: Every day | ORAL | Status: DC
  Administered 2012-03-12 – 2012-03-13 (×2): 2 via ORAL

## 2012-03-12 MED ORDER — ONDANSETRON HCL 4 MG/2ML IJ SOLN
4.0000 mg | Freq: Once | INTRAMUSCULAR | Status: AC
  Administered 2012-03-12: 4 mg via INTRAVENOUS

## 2012-03-12 MED ORDER — ACETAMINOPHEN 325 MG PO TABS: 650.0000 mg | ORAL_TABLET | ORAL | Status: DC | PRN

## 2012-03-12 MED ORDER — ONDANSETRON HCL 4 MG/2ML IJ SOLN
4.0000 mg | Freq: Four times a day (QID) | INTRAMUSCULAR | Status: DC | PRN
  Administered 2012-03-12: 4 mg via INTRAVENOUS
  Filled 2012-03-12 (×2): qty 2

## 2012-03-12 MED ORDER — PROMETHAZINE HCL 25 MG/ML IJ SOLN
12.5000 mg | Freq: Once | INTRAMUSCULAR | Status: AC
  Administered 2012-03-12: 12.5 mg via INTRAVENOUS
  Filled 2012-03-12: qty 1

## 2012-03-12 MED ORDER — DIPHENHYDRAMINE HCL 50 MG/ML IJ SOLN: 12.5000 mg | INTRAMUSCULAR | Status: DC | PRN

## 2012-03-12 MED ORDER — PHENYLEPHRINE 40 MCG/ML (10ML) SYRINGE FOR IV PUSH (FOR BLOOD PRESSURE SUPPORT): 80.0000 ug | PREFILLED_SYRINGE | INTRAVENOUS | Status: DC | PRN

## 2012-03-12 MED ORDER — WITCH HAZEL-GLYCERIN EX PADS: 1.0000 "application " | MEDICATED_PAD | CUTANEOUS | Status: DC | PRN

## 2012-03-12 MED ORDER — PRENATAL MULTIVITAMIN CH
1.0000 | ORAL_TABLET | Freq: Every day | ORAL | Status: DC
  Administered 2012-03-13 – 2012-03-14 (×2): 1 via ORAL
  Filled 2012-03-12 (×2): qty 1

## 2012-03-12 MED ORDER — OXYTOCIN 20 UNITS IN LACTATED RINGERS INFUSION - SIMPLE
125.0000 mL/h | Freq: Once | INTRAVENOUS | Status: DC
Start: 2012-03-12 — End: 2012-03-12

## 2012-03-12 MED ORDER — BENZOCAINE-MENTHOL 20-0.5 % EX AERO
INHALATION_SPRAY | CUTANEOUS | Status: AC
  Filled 2012-03-12: qty 56

## 2012-03-12 MED ORDER — BUTORPHANOL TARTRATE 2 MG/ML IJ SOLN
1.0000 mg | Freq: Once | INTRAMUSCULAR | Status: AC
  Administered 2012-03-12: 1 mg via INTRAVENOUS

## 2012-03-12 MED ORDER — BUTORPHANOL TARTRATE 2 MG/ML IJ SOLN
INTRAMUSCULAR | Status: AC
  Administered 2012-03-12: 1 mg via INTRAVENOUS
  Filled 2012-03-12: qty 1

## 2012-03-12 MED ORDER — LIDOCAINE HCL (PF) 1 % IJ SOLN
INTRAMUSCULAR | Status: DC | PRN
  Administered 2012-03-12 (×2): 9 mL

## 2012-03-12 MED ORDER — ZOLPIDEM TARTRATE 5 MG PO TABS: 5.0000 mg | ORAL_TABLET | Freq: Every evening | ORAL | Status: DC | PRN

## 2012-03-12 MED ORDER — FENTANYL 2.5 MCG/ML BUPIVACAINE 1/10 % EPIDURAL INFUSION (WH - ANES)
14.0000 mL/h | INTRAMUSCULAR | Status: DC
  Filled 2012-03-12: qty 60

## 2012-03-12 MED ORDER — BENZOCAINE-MENTHOL 20-0.5 % EX AERO
1.0000 "application " | INHALATION_SPRAY | CUTANEOUS | Status: DC | PRN
  Administered 2012-03-12: 1 via TOPICAL

## 2012-03-12 MED ORDER — LACTATED RINGERS IV SOLN
500.0000 mL | Freq: Once | INTRAVENOUS | Status: AC
  Administered 2012-03-12: 18:00:00 via INTRAVENOUS

## 2012-03-12 MED ORDER — LIDOCAINE HCL (PF) 1 % IJ SOLN: 30.0000 mL | INTRAMUSCULAR | Status: DC | PRN

## 2012-03-12 MED ORDER — ONDANSETRON HCL 4 MG PO TABS
4.0000 mg | ORAL_TABLET | ORAL | Status: DC | PRN
  Administered 2012-03-13 – 2012-03-14 (×4): 4 mg via ORAL
  Filled 2012-03-12 (×4): qty 1

## 2012-03-12 MED ORDER — OXYTOCIN BOLUS FROM INFUSION
500.0000 mL | Freq: Once | INTRAVENOUS | Status: DC
  Filled 2012-03-12: qty 500

## 2012-03-12 MED ORDER — TETANUS-DIPHTH-ACELL PERTUSSIS 5-2.5-18.5 LF-MCG/0.5 IM SUSP
0.5000 mL | Freq: Once | INTRAMUSCULAR | Status: AC
  Administered 2012-03-13: 0.5 mL via INTRAMUSCULAR

## 2012-03-12 MED ORDER — LANOLIN HYDROUS EX OINT: TOPICAL_OINTMENT | CUTANEOUS | Status: DC | PRN

## 2012-03-12 MED ORDER — TERBUTALINE SULFATE 1 MG/ML IJ SOLN: 0.2500 mg | Freq: Once | INTRAMUSCULAR | Status: DC | PRN

## 2012-03-12 MED ORDER — SIMETHICONE 80 MG PO CHEW: 80.0000 mg | CHEWABLE_TABLET | ORAL | Status: DC | PRN

## 2012-03-12 MED ORDER — LACTATED RINGERS IV SOLN
500.0000 mL | INTRAVENOUS | Status: DC | PRN
Start: 2012-03-12 — End: 2012-03-12

## 2012-03-12 MED ORDER — PHENYLEPHRINE 40 MCG/ML (10ML) SYRINGE FOR IV PUSH (FOR BLOOD PRESSURE SUPPORT)
80.0000 ug | PREFILLED_SYRINGE | INTRAVENOUS | Status: DC | PRN
  Filled 2012-03-12: qty 5

## 2012-03-12 MED ORDER — EPHEDRINE 5 MG/ML INJ
10.0000 mg | INTRAVENOUS | Status: DC | PRN
  Filled 2012-03-12: qty 4

## 2012-03-12 MED ORDER — FLEET ENEMA 7-19 GM/118ML RE ENEM: 1.0000 | ENEMA | RECTAL | Status: DC | PRN

## 2012-03-12 MED ORDER — IBUPROFEN 600 MG PO TABS
600.0000 mg | ORAL_TABLET | Freq: Four times a day (QID) | ORAL | Status: DC
  Administered 2012-03-12 – 2012-03-14 (×6): 600 mg via ORAL
  Filled 2012-03-12 (×6): qty 1

## 2012-03-12 MED ORDER — ONDANSETRON HCL 4 MG/2ML IJ SOLN: 4.0000 mg | INTRAMUSCULAR | Status: DC | PRN

## 2012-03-12 MED ORDER — IBUPROFEN 600 MG PO TABS
600.0000 mg | ORAL_TABLET | Freq: Four times a day (QID) | ORAL | Status: DC | PRN
  Administered 2012-03-12: 600 mg via ORAL
  Filled 2012-03-12: qty 1

## 2012-03-12 MED ORDER — OXYCODONE-ACETAMINOPHEN 5-325 MG PO TABS
1.0000 | ORAL_TABLET | ORAL | Status: DC | PRN
  Administered 2012-03-12 – 2012-03-14 (×8): 2 via ORAL
  Filled 2012-03-12 (×8): qty 2

## 2012-03-12 MED ORDER — CITRIC ACID-SODIUM CITRATE 334-500 MG/5ML PO SOLN: 30.0000 mL | ORAL | Status: DC | PRN

## 2012-03-12 MED ORDER — EPHEDRINE 5 MG/ML INJ: 10.0000 mg | INTRAVENOUS | Status: DC | PRN

## 2012-03-12 MED ORDER — OXYTOCIN 20 UNITS IN LACTATED RINGERS INFUSION - SIMPLE
1.0000 m[IU]/min | INTRAVENOUS | Status: DC
  Administered 2012-03-12: 2 m[IU]/min via INTRAVENOUS
  Filled 2012-03-12: qty 1000

## 2012-03-12 NOTE — Progress Notes (Signed)
  Subjective: Received stadol x 2 doses  Objective: BP 119/84  Pulse 83  Temp(Src) 98.5 F (36.9 C) (Oral)  Resp 20  Ht 5\' 3"  (1.6 m)  Wt 62.143 kg (137 lb)  BMI 24.27 kg/m2  LMP 05/09/2011      FHT:  FHR: 130 bpm, variability: moderate,  accelerations:  Present,  decelerations:  Absent UC:   regular, every 4 minutes SVE:   4/80/-1 Labs: Lab Results  Component Value Date   WBC 15.9* 03/12/2012   HGB 12.3 03/12/2012   HCT 37.0 03/12/2012   MCV 87.9 03/12/2012   PLT 177 03/12/2012    Assessment / Plan: Pt coping with IV pain meds, will continue to follow progress   Aking Klabunde W 03/12/2012, 1:33 PM

## 2012-03-12 NOTE — Anesthesia Procedure Notes (Signed)
Epidural Patient location during procedure: OB Start time: 03/12/2012 4:08 PM End time: 03/12/2012 4:11 PM Reason for block: procedure for pain  Staffing Anesthesiologist: Sandrea Hughs Performed by: anesthesiologist   Preanesthetic Checklist Completed: patient identified, site marked, surgical consent, pre-op evaluation, timeout performed, IV checked, risks and benefits discussed and monitors and equipment checked  Epidural Patient position: sitting Prep: site prepped and draped and DuraPrep Patient monitoring: continuous pulse ox and blood pressure Approach: midline Injection technique: LOR air  Needle:  Needle type: Tuohy  Needle gauge: 17 G Needle length: 9 cm Needle insertion depth: 5 cm cm Catheter type: closed end flexible Catheter size: 19 Gauge Catheter at skin depth: 9 cm Test dose: negative and Other  Assessment Events: blood not aspirated, injection not painful, no injection resistance, negative IV test and no paresthesia

## 2012-03-12 NOTE — Anesthesia Preprocedure Evaluation (Signed)
Anesthesia Evaluation  Patient identified by MRN, date of birth, ID band Patient awake    Reviewed: Allergy & Precautions, H&P , NPO status , Patient's Chart, lab work & pertinent test results  Airway Mallampati: I TM Distance: >3 FB Neck ROM: full    Dental No notable dental hx.    Pulmonary neg pulmonary ROS,  breath sounds clear to auscultation  Pulmonary exam normal       Cardiovascular negative cardio ROS      Neuro/Psych negative neurological ROS  negative psych ROS   GI/Hepatic Neg liver ROS, GERD-  ,  Endo/Other  negative endocrine ROS  Renal/GU negative Renal ROS  negative genitourinary   Musculoskeletal negative musculoskeletal ROS (+)   Abdominal Normal abdominal exam  (+)   Peds  Hematology negative hematology ROS (+)   Anesthesia Other Findings   Reproductive/Obstetrics (+) Pregnancy                           Anesthesia Physical Anesthesia Plan  ASA: II  Anesthesia Plan: Epidural   Post-op Pain Management:    Induction:   Airway Management Planned:   Additional Equipment:   Intra-op Plan:   Post-operative Plan:   Informed Consent: I have reviewed the patients History and Physical, chart, labs and discussed the procedure including the risks, benefits and alternatives for the proposed anesthesia with the patient or authorized representative who has indicated his/her understanding and acceptance.     Plan Discussed with:   Anesthesia Plan Comments:         Anesthesia Quick Evaluation

## 2012-03-12 NOTE — H&P (Signed)
Mary Lowe is a 31 y.o. female [id number] at 58 weeks (EDD 03/17/12 by LMP c/w 8 week [place]) presenting for induction of labor given term and favorable.  Prenatal care uneventful except +smoker status, cut down with pregnancy.  Rh negative--received rhogam.  History OB History    Grav Para Term Preterm Abortions TAB SAB Ect Mult Living   4 2 2  0 1 0 1 0 0 2    2004 NSVD 8#5oz 2006 NSVD 7#4oz 2011 SAB  Past Medical History  Diagnosis Date  . No pertinent past medical history   . IBS (irritable bowel syndrome)    Past Surgical History  Procedure Date  . No past surgeries    Family History: family history includes Alcohol abuse in her father, paternal grandfather, paternal grandmother, and paternal uncle; Cancer in her paternal grandfather; Depression in her father and mother; Diabetes in her maternal grandfather; Heart disease in her father; and Hypertension in her father.  There is no history of Anesthesia problems, and Hypotension, and Malignant hyperthermia, and Pseudochol deficiency, . Social History:  reports that she has been smoking.  She has never used smokeless tobacco. She reports that she does not drink alcohol or use illicit drugs.  ROS    Temperature 97.5 F (36.4 C), temperature source Oral, last menstrual period 05/09/2011. Maternal Exam:  Uterine Assessment: Contraction strength is mild.  Contraction frequency is irregular.   Abdomen: Patient reports no abdominal tenderness. Fetal presentation: vertex  Introitus: Normal vulva. Normal vagina.    Physical Exam  Constitutional: She is oriented to person, place, and time. She appears well-developed and well-nourished.  Cardiovascular: Normal rate and regular rhythm.   Respiratory: Effort normal and breath sounds normal.  GI: Soft.  Genitourinary: Vagina normal and uterus normal.  Neurological: She is alert and oriented to person, place, and time.  Psychiatric: She has a normal mood and affect. Her behavior is normal.     Prenatal labs: ABO, Rh: A/Negative/-- (08/27 0000) Antibody: Negative (08/27 0000) Rubella: Immune (08/27 0000) RPR: Nonreactive (08/27 0000)  HBsAg: Negative (08/27 0000)  HIV: Non-reactive (08/27 0000)  GBS: Negative (03/04 0000)  One hour GTT 128 First trimester screen and AFP WNL   Assessment/Plan: Pt in for induction given term and favorable cervix.  No significant prenatal problems.  Plan epidural prn.  Oliver Pila 03/12/2012, 7:58 AM

## 2012-03-13 LAB — CBC
MCH: 28.9 pg (ref 26.0–34.0)
MCHC: 32.2 g/dL (ref 30.0–36.0)
MCV: 89.7 fL (ref 78.0–100.0)
Platelets: 160 10*3/uL (ref 150–400)
RDW: 13.8 % (ref 11.5–15.5)

## 2012-03-13 MED ORDER — CALCIUM CARBONATE ANTACID 500 MG PO CHEW
2.0000 | CHEWABLE_TABLET | ORAL | Status: DC | PRN
  Administered 2012-03-13: 400 mg via ORAL

## 2012-03-13 MED ORDER — FAMOTIDINE 20 MG PO TABS
20.0000 mg | ORAL_TABLET | Freq: Two times a day (BID) | ORAL | Status: DC
  Administered 2012-03-13 – 2012-03-14 (×2): 20 mg via ORAL
  Filled 2012-03-13: qty 1

## 2012-03-13 MED ORDER — FAMOTIDINE 20 MG PO TABS: 10.0000 mg | ORAL_TABLET | Freq: Two times a day (BID) | ORAL | Status: DC

## 2012-03-13 MED ORDER — NITROFURANTOIN MONOHYD MACRO 100 MG PO CAPS
100.0000 mg | ORAL_CAPSULE | Freq: Two times a day (BID) | ORAL | Status: DC
  Administered 2012-03-13 – 2012-03-14 (×3): 100 mg via ORAL
  Filled 2012-03-13 (×5): qty 1

## 2012-03-13 MED ORDER — RHO D IMMUNE GLOBULIN 1500 UNIT/2ML IJ SOLN
300.0000 ug | Freq: Once | INTRAMUSCULAR | Status: AC
  Administered 2012-03-13: 300 ug via INTRAMUSCULAR
  Filled 2012-03-13: qty 2

## 2012-03-13 NOTE — Progress Notes (Signed)
Post Partum Day 1 Subjective: no complaints and tolerating PO  Objective: Blood pressure 96/61, pulse 84, temperature 98.2 F (36.8 C), temperature source Oral, resp. rate 18, height 5\' 3"  (1.6 m), weight 62.143 kg (137 lb), last menstrual period 05/09/2011, SpO2 98.00%, unknown if currently breastfeeding.  Physical Exam:  General: alert Lochia: appropriate Uterine Fundus: firm    Basename 03/13/12 0500 03/12/12 0800  HGB 10.7* 12.3  HCT 33.2* 37.0    Assessment/Plan: Plan for discharge tomorrow D/w pt risks and benefits of circumcision in detail and she desires to proceed.   LOS: 1 day   Darshan Solanki W 03/13/2012, 9:06 AM

## 2012-03-13 NOTE — Anesthesia Postprocedure Evaluation (Signed)
  Anesthesia Post-op Note  Patient: Mary Lowe  Procedure(s) Performed: * No procedures listed *  Patient Location: Mother/Baby  Anesthesia Type: Epidural  Level of Consciousness: awake, alert  and oriented  Airway and Oxygen Therapy: Patient Spontanous Breathing  Post-op Pain: mild  Post-op Assessment: Patient's Cardiovascular Status Stable, Respiratory Function Stable, Patent Airway, No signs of Nausea or vomiting and Pain level controlled  Post-op Vital Signs: stable  Complications: No apparent anesthesia complications

## 2012-03-14 LAB — URINE CULTURE

## 2012-03-14 LAB — RH IG WORKUP (INCLUDES ABO/RH): Unit division: 0

## 2012-03-14 MED ORDER — NITROFURANTOIN MONOHYD MACRO 100 MG PO CAPS: 100.0000 mg | ORAL_CAPSULE | Freq: Two times a day (BID) | ORAL | Status: AC

## 2012-03-14 MED ORDER — OXYCODONE-ACETAMINOPHEN 5-325 MG PO TABS: 1.0000 | ORAL_TABLET | ORAL | Status: AC | PRN

## 2012-03-14 MED ORDER — IBUPROFEN 600 MG PO TABS: 600.0000 mg | ORAL_TABLET | Freq: Four times a day (QID) | ORAL | Status: AC

## 2012-03-14 NOTE — Discharge Summary (Signed)
Obstetric Discharge Summary Reason for Admission: induction of labor Prenatal Procedures: none Intrapartum Procedures: spontaneous vaginal delivery Postpartum Procedures: Rho(D) Ig and antibiotics for UTI Complications-Operative and Postpartum: first degree perineal laceration Hemoglobin  Date Value Range Status  03/13/2012 10.7* 12.0-15.0 (g/dL) Final     HCT  Date Value Range Status  03/13/2012 33.2* 36.0-46.0 (%) Final    Physical Exam:  General: alert Lochia: appropriate Uterine Fundus: firm   Discharge Diagnoses: Term Pregnancy-delivered  Discharge Information: Date: 03/14/2012 Activity: pelvic rest Diet: routine Medications: Ibuprofen, Percocet and macrodantin Condition: improved Instructions: refer to practice specific booklet Discharge to: home   Newborn Data: Live born female  Birth Weight: 8 lb 10.6 oz (3929 g) APGAR: 9, 9  Home with mother.  Oliver Pila 03/14/2012, 10:18 AM

## 2012-03-14 NOTE — Progress Notes (Signed)
Post Partum Day 2 Subjective: no complaints, up ad lib and tolerating PO Dysuria improving with macrodantin  Objective: Blood pressure 119/83, pulse 76, temperature 97.5 F (36.4 C), temperature source Oral, resp. rate 18, height 5\' 3"  (1.6 m), weight 62.143 kg (137 lb), last menstrual period 05/09/2011, SpO2 97.00%, unknown if currently breastfeeding.  Physical Exam:  General: alert Lochia: appropriate Uterine Fundus: firm     Basename 03/13/12 0500 03/12/12 0800  HGB 10.7* 12.3  HCT 33.2* 37.0    Assessment/Plan: Discharge home Motrin, percocet, and macrodantin F/u 6 weeks    LOS: 2 days   Mary Lowe W 03/14/2012, 10:14 AM

## 2012-03-15 NOTE — Progress Notes (Signed)
Post discharge chart review completed.  

## 2013-11-19 ENCOUNTER — Emergency Department (HOSPITAL_COMMUNITY)
Admission: EM | Admit: 2013-11-19 | Discharge: 2013-11-19 | Disposition: A | Discharge status: Home / Self Care | Payer: Medicaid Other | Attending: Emergency Medicine | Admitting: Emergency Medicine

## 2013-11-19 ENCOUNTER — Emergency Department (HOSPITAL_COMMUNITY): Payer: Medicaid Other

## 2013-11-19 ENCOUNTER — Encounter (HOSPITAL_COMMUNITY): Payer: Self-pay | Admitting: Emergency Medicine

## 2013-11-19 DIAGNOSIS — Z9089 Acquired absence of other organs: Secondary | ICD-10-CM | POA: Insufficient documentation

## 2013-11-19 DIAGNOSIS — F17200 Nicotine dependence, unspecified, uncomplicated: Secondary | ICD-10-CM | POA: Insufficient documentation

## 2013-11-19 DIAGNOSIS — IMO0001 Reserved for inherently not codable concepts without codable children: Secondary | ICD-10-CM | POA: Insufficient documentation

## 2013-11-19 DIAGNOSIS — J029 Acute pharyngitis, unspecified: Secondary | ICD-10-CM | POA: Insufficient documentation

## 2013-11-19 DIAGNOSIS — J3489 Other specified disorders of nose and nasal sinuses: Secondary | ICD-10-CM | POA: Insufficient documentation

## 2013-11-19 DIAGNOSIS — B349 Viral infection, unspecified: Secondary | ICD-10-CM

## 2013-11-19 DIAGNOSIS — R197 Diarrhea, unspecified: Secondary | ICD-10-CM | POA: Insufficient documentation

## 2013-11-19 DIAGNOSIS — R112 Nausea with vomiting, unspecified: Secondary | ICD-10-CM | POA: Insufficient documentation

## 2013-11-19 DIAGNOSIS — R05 Cough: Secondary | ICD-10-CM | POA: Insufficient documentation

## 2013-11-19 DIAGNOSIS — R059 Cough, unspecified: Secondary | ICD-10-CM | POA: Insufficient documentation

## 2013-11-19 DIAGNOSIS — M549 Dorsalgia, unspecified: Secondary | ICD-10-CM | POA: Insufficient documentation

## 2013-11-19 DIAGNOSIS — Z79899 Other long term (current) drug therapy: Secondary | ICD-10-CM | POA: Insufficient documentation

## 2013-11-19 DIAGNOSIS — R0789 Other chest pain: Secondary | ICD-10-CM | POA: Insufficient documentation

## 2013-11-19 DIAGNOSIS — B9789 Other viral agents as the cause of diseases classified elsewhere: Secondary | ICD-10-CM | POA: Insufficient documentation

## 2013-11-19 DIAGNOSIS — Z8719 Personal history of other diseases of the digestive system: Secondary | ICD-10-CM | POA: Insufficient documentation

## 2013-11-19 LAB — RAPID STREP SCREEN (MED CTR MEBANE ONLY): Streptococcus, Group A Screen (Direct): NEGATIVE

## 2013-11-19 MED ORDER — SODIUM CHLORIDE 0.9 % IV BOLUS (SEPSIS)
1000.0000 mL | Freq: Once | INTRAVENOUS | Status: AC
  Administered 2013-11-19: 1000 mL via INTRAVENOUS

## 2013-11-19 MED ORDER — MORPHINE SULFATE 4 MG/ML IJ SOLN
4.0000 mg | Freq: Once | INTRAMUSCULAR | Status: AC
  Administered 2013-11-19: 4 mg via INTRAVENOUS
  Filled 2013-11-19: qty 1

## 2013-11-19 MED ORDER — ONDANSETRON HCL 4 MG/2ML IJ SOLN
4.0000 mg | Freq: Once | INTRAMUSCULAR | Status: AC
  Administered 2013-11-19: 4 mg via INTRAVENOUS
  Filled 2013-11-19: qty 2

## 2013-11-19 MED ORDER — KETOROLAC TROMETHAMINE 30 MG/ML IJ SOLN
30.0000 mg | Freq: Once | INTRAMUSCULAR | Status: AC
  Administered 2013-11-19: 30 mg via INTRAVENOUS
  Filled 2013-11-19: qty 1

## 2013-11-19 MED ORDER — HYDROCODONE-ACETAMINOPHEN 5-325 MG PO TABS: 1.0000 | ORAL_TABLET | Freq: Four times a day (QID) | ORAL | Status: DC | PRN

## 2013-11-19 NOTE — ED Provider Notes (Signed)
CSN: [id number]     Arrival date & time 11/19/13  1934 History  This chart was scribed for [employer]. Rubin Payor, MD by Joaquin Music, ED Scribe. This patient was seen in room APA10/APA10 and the patient's care was started at 8:43 PM.   No chief complaint on file.  The history is provided by the patient. No language interpreter was used.   HPI Comments: Mary Lowe is a 32 y.o. female who presents to the Emergency Department complaining of ongoing, worsening coughing, myalgias, CP, back pain, nausea, and emesis that began 6 days ago. Pt states she has not been improving with her symptoms. She states she has been having diarrhea for the past 4 days. She states her cough is "dry" and denies having any sputum. Pt reports having a tonsillectomy at the beginning of October 2014 and states she is still having the change of voice/hoarse and sore throat. Pt states she recently traveled to [place] for a missionary trip. She states she traveled with her uncle and denies him being ill. She denies any other sick contacts.  Past Medical History  Diagnosis Date  . No pertinent past medical history   . IBS (irritable bowel syndrome)    Past Surgical History  Procedure Laterality Date  . No past surgeries    . Tonsillectomy     Family History  Problem Relation Age of Onset  . Depression Mother   . Depression Father   . Heart disease Father   . Alcohol abuse Father   . Hypertension Father   . Anesthesia problems Neg Hx   . Hypotension Neg Hx   . Malignant hyperthermia Neg Hx   . Pseudochol deficiency Neg Hx   . Alcohol abuse Paternal Uncle   . Diabetes Maternal Grandfather   . Alcohol abuse Paternal Grandmother   . Alcohol abuse Paternal Grandfather   . Cancer Paternal Grandfather     lung   History  Substance Use Topics  . Smoking status: Current Every Day Smoker -- 0.25 packs/day  . Smokeless tobacco: Never Used  . Alcohol Use: No   OB History   Grav Para Term Preterm  Abortions TAB SAB Ect Mult Living   4 3 3  0 1 0 1 0 0 3     Review of Systems  Constitutional: Positive for fever.  HENT: Positive for congestion, postnasal drip, sore throat, trouble swallowing and voice change.   Respiratory: Positive for cough.   Cardiovascular: Positive for chest pain.  Gastrointestinal: Positive for nausea, vomiting and diarrhea.  Musculoskeletal: Positive for back pain and myalgias.    Allergies  Sulfa antibiotics  Home Medications   Current Outpatient Rx  Name  Route  Sig  Dispense  Refill  . acetaminophen (TYLENOL) 500 MG tablet   Oral   Take 1,000 mg by mouth every 6 (six) hours as needed for mild pain, moderate pain or fever.         . cetirizine (ZYRTEC) 10 MG tablet   Oral   Take 10 mg by mouth daily.         Marland Kitchen ibuprofen (ADVIL,MOTRIN) 200 MG tablet   Oral   Take 400-600 mg by mouth every 6 (six) hours as needed for fever, mild pain or moderate pain.          Marland Kitchen omeprazole (PRILOSEC OTC) 20 MG tablet   Oral   Take 20 mg by mouth daily.         Marland Kitchen HYDROcodone-acetaminophen (NORCO/VICODIN) 5-325 MG  per tablet   Oral   Take 1-2 tablets by mouth every 6 (six) hours as needed for moderate pain (cough).   12 tablet   0    BP 121/74  Pulse 63  Temp(Src) 97.8 F (36.6 C) (Oral)  Resp 16  Ht 5\' 3"  (1.6 m)  Wt 113 lb (51.256 kg)  BMI 20.02 kg/m2  SpO2 97%  LMP 11/06/2013  Physical Exam  HENT:  Nasal congestion.Mild posterior erythema  with ecaudate. Lymphadenopathy.  Cardiovascular:  Mild tachycardy   Pulmonary/Chest: She has no wheezes. She has no rales.  No rales or rhonci.   ED Course  Procedures COORDINATION OF CARE: 8:49 PM-Discussed treatment plan which includes rapid strep and labs. Will administer Toradol and Zofran IV while in ED. Pt agreed to plan.   Labs Review Labs Reviewed  RAPID STREP SCREEN  CULTURE, GROUP A STREP  INFLUENZA PANEL BY PCR   Imaging Review Dg Chest 2 View  11/19/2013   CLINICAL DATA:   Cough, congestion, fever, laryngitis.  EXAM: CHEST  2 VIEW  COMPARISON:  None.  FINDINGS: The heart size and mediastinal contours are within normal limits. Both lungs are clear. The visualized skeletal structures are unremarkable.  IMPRESSION: No active cardiopulmonary disease.   Electronically Signed   By: Burman Nieves M.D.   On: 11/19/2013 21:37    EKG Interpretation   None       MDM   1. Viral infection    Patient with cough nausea vomiting. Likely viral infection. Strep negative. X-ray negative. Will discharge home.  I personally performed the services described in this documentation, which was scribed in my presence. The recorded information has been reviewed and is accurate.     Juliet Rude. Rubin Payor, MD 11/19/13 [id number]

## 2013-11-19 NOTE — ED Notes (Signed)
Pt states she has not felt well for about 6 days, cough, fever, sore throat, some vomiting and mild diarrhea.   Pt has an uncle that returned from [place] approx 2 weeks ago, but her Kateri Mc has not had any symptoms of illness.

## 2013-11-20 LAB — INFLUENZA PANEL BY PCR (TYPE A & B)
Influenza A By PCR: NEGATIVE
Influenza B By PCR: NEGATIVE

## 2013-11-22 LAB — CULTURE, GROUP A STREP

## 2014-03-09 ENCOUNTER — Encounter: Payer: Self-pay | Admitting: Gastroenterology

## 2014-04-24 ENCOUNTER — Ambulatory Visit: Payer: Medicaid Other | Admitting: Gastroenterology

## 2015-06-18 ENCOUNTER — Emergency Department (HOSPITAL_COMMUNITY): Payer: Medicaid Other

## 2015-06-18 ENCOUNTER — Encounter (HOSPITAL_COMMUNITY): Payer: Self-pay | Admitting: *Deleted

## 2015-06-18 ENCOUNTER — Emergency Department (HOSPITAL_COMMUNITY)
Admission: EM | Admit: 2015-06-18 | Discharge: 2015-06-18 | Disposition: A | Discharge status: Home / Self Care | Payer: Medicaid Other | Attending: Emergency Medicine | Admitting: Emergency Medicine

## 2015-06-18 DIAGNOSIS — Z8719 Personal history of other diseases of the digestive system: Secondary | ICD-10-CM | POA: Insufficient documentation

## 2015-06-18 DIAGNOSIS — O99331 Smoking (tobacco) complicating pregnancy, first trimester: Secondary | ICD-10-CM | POA: Diagnosis not present

## 2015-06-18 DIAGNOSIS — R197 Diarrhea, unspecified: Secondary | ICD-10-CM | POA: Diagnosis not present

## 2015-06-18 DIAGNOSIS — O9989 Other specified diseases and conditions complicating pregnancy, childbirth and the puerperium: Secondary | ICD-10-CM | POA: Diagnosis not present

## 2015-06-18 DIAGNOSIS — Z3A01 Less than 8 weeks gestation of pregnancy: Secondary | ICD-10-CM | POA: Diagnosis not present

## 2015-06-18 DIAGNOSIS — F17210 Nicotine dependence, cigarettes, uncomplicated: Secondary | ICD-10-CM | POA: Insufficient documentation

## 2015-06-18 DIAGNOSIS — R110 Nausea: Secondary | ICD-10-CM | POA: Insufficient documentation

## 2015-06-18 DIAGNOSIS — R102 Pelvic and perineal pain: Secondary | ICD-10-CM | POA: Diagnosis not present

## 2015-06-18 DIAGNOSIS — Z9851 Tubal ligation status: Secondary | ICD-10-CM | POA: Diagnosis not present

## 2015-06-18 DIAGNOSIS — O26891 Other specified pregnancy related conditions, first trimester: Secondary | ICD-10-CM

## 2015-06-18 HISTORY — DX: Crohn's disease, unspecified, without complications: K50.90

## 2015-06-18 LAB — BASIC METABOLIC PANEL
ANION GAP: 5 (ref 5–15)
BUN: 13 mg/dL (ref 6–20)
CALCIUM: 8.5 mg/dL — AB (ref 8.9–10.3)
CHLORIDE: 108 mmol/L (ref 101–111)
CO2: 22 mmol/L (ref 22–32)
CREATININE: 0.69 mg/dL (ref 0.44–1.00)
GFR calc non Af Amer: >60 mL/min (ref >60)
Glucose, Bld: 95 mg/dL (ref 65–99)
Potassium: 3.5 mmol/L (ref 3.5–5.1)
Sodium: 135 mmol/L (ref 135–145)

## 2015-06-18 LAB — I-STAT CG4 LACTIC ACID, ED: Lactic Acid, Venous: <0.3 mmol/L — ABNORMAL LOW (ref 0.5–2.0)

## 2015-06-18 LAB — URINALYSIS, ROUTINE W REFLEX MICROSCOPIC
Bilirubin Urine: NEGATIVE
Glucose, UA: NEGATIVE mg/dL
Hgb urine dipstick: NEGATIVE
Ketones, ur: NEGATIVE mg/dL
LEUKOCYTES UA: NEGATIVE
NITRITE: NEGATIVE
PH: 6 (ref 5.0–8.0)
Protein, ur: NEGATIVE mg/dL
SPECIFIC GRAVITY, URINE: 1.02 (ref 1.005–1.030)
Urobilinogen, UA: 0.2 mg/dL (ref 0.0–1.0)

## 2015-06-18 LAB — WET PREP, GENITAL
Trich, Wet Prep: NONE SEEN
YEAST WET PREP: NONE SEEN

## 2015-06-18 LAB — CBC WITH DIFFERENTIAL/PLATELET
BASOS ABS: 0 10*3/uL (ref 0.0–0.1)
Basophils Relative: 0 % (ref 0–1)
Eosinophils Absolute: 0.3 10*3/uL (ref 0.0–0.7)
Eosinophils Relative: 2 % (ref 0–5)
HCT: 39.4 % (ref 36.0–46.0)
HEMOGLOBIN: 13.5 g/dL (ref 12.0–15.0)
LYMPHS ABS: 3.7 10*3/uL (ref 0.7–4.0)
Lymphocytes Relative: 27 % (ref 12–46)
MCH: 31.1 pg (ref 26.0–34.0)
MCHC: 34.3 g/dL (ref 30.0–36.0)
MCV: 90.8 fL (ref 78.0–100.0)
MONOS PCT: 8 % (ref 3–12)
Monocytes Absolute: 1.1 10*3/uL — ABNORMAL HIGH (ref 0.1–1.0)
NEUTROS PCT: 63 % (ref 43–77)
Neutro Abs: 8.3 10*3/uL — ABNORMAL HIGH (ref 1.7–7.7)
Platelets: 236 10*3/uL (ref 150–400)
RBC: 4.34 MIL/uL (ref 3.87–5.11)
RDW: 13.1 % (ref 11.5–15.5)
WBC: 13.4 10*3/uL — ABNORMAL HIGH (ref 4.0–10.5)

## 2015-06-18 LAB — PREGNANCY, URINE: Preg Test, Ur: POSITIVE — AB

## 2015-06-18 LAB — HCG, QUANTITATIVE, PREGNANCY: HCG, BETA CHAIN, QUANT, S: 985 m[IU]/mL — AB (ref <5)

## 2015-06-18 MED ORDER — HYDROMORPHONE HCL 1 MG/ML IJ SOLN
0.5000 mg | Freq: Once | INTRAMUSCULAR | Status: AC
  Administered 2015-06-18: 0.5 mg via INTRAVENOUS
  Filled 2015-06-18: qty 1

## 2015-06-18 MED ORDER — ONDANSETRON HCL 4 MG/2ML IJ SOLN
4.0000 mg | Freq: Once | INTRAMUSCULAR | Status: AC
  Administered 2015-06-18: 4 mg via INTRAVENOUS
  Filled 2015-06-18: qty 2

## 2015-06-18 MED ORDER — PRENATAL VITAMINS 0.8 MG PO TABS: 1.0000 | ORAL_TABLET | Freq: Every day | ORAL | Status: DC

## 2015-06-18 MED ORDER — SODIUM CHLORIDE 0.9 % IV SOLN
1000.0000 mL | Freq: Once | INTRAVENOUS | Status: AC
  Administered 2015-06-18: 1000 mL via INTRAVENOUS

## 2015-06-18 MED ORDER — ONDANSETRON HCL 4 MG PO TABS: 4.0000 mg | ORAL_TABLET | Freq: Four times a day (QID) | ORAL | Status: DC | PRN

## 2015-06-18 MED ORDER — SODIUM CHLORIDE 0.9 % IV SOLN
1000.0000 mL | INTRAVENOUS | Status: DC
  Administered 2015-06-18: 1000 mL via INTRAVENOUS

## 2015-06-18 MED ORDER — OXYCODONE-ACETAMINOPHEN 5-325 MG PO TABS: 1.0000 | ORAL_TABLET | ORAL | Status: DC | PRN

## 2015-06-18 MED ORDER — DOXYLAMINE-PYRIDOXINE 10-10 MG PO TBEC: 2.0000 | DELAYED_RELEASE_TABLET | Freq: Every day | ORAL | Status: DC

## 2015-06-18 NOTE — Discharge Instructions (Signed)
Your ultrasound showed you are five weeks, one day pregnant, with due date February 17, 2016. The radiologist recommends repeat ultrasound in two weeks.  Abdominal Pain During Pregnancy Abdominal pain is common in pregnancy. Most of the time, it does not cause harm. There are many causes of abdominal pain. Some causes are more serious than others. Some of the causes of abdominal pain in pregnancy are easily diagnosed. Occasionally, the diagnosis takes time to understand. Other times, the cause is not determined. Abdominal pain can be a sign that something is very wrong with the pregnancy, or the pain may have nothing to do with the pregnancy at all. For this reason, always tell your health care provider if you have any abdominal discomfort. HOME CARE INSTRUCTIONS  Monitor your abdominal pain for any changes. The following actions may help to alleviate any discomfort you are experiencing:  Do not have sexual intercourse or put anything in your vagina until your symptoms go away completely.  Get plenty of rest until your pain improves.  Drink clear fluids if you feel nauseous. Avoid solid food as long as you are uncomfortable or nauseous.  Only take over-the-counter or prescription medicine as directed by your health care provider.  Keep all follow-up appointments with your health care provider. SEEK IMMEDIATE MEDICAL CARE IF:  You are bleeding, leaking fluid, or passing tissue from the vagina.  You have increasing pain or cramping.  You have persistent vomiting.  You have painful or bloody urination.  You have a fever.  You notice a decrease in your baby's movements.  You have extreme weakness or feel faint.  You have shortness of breath, with or without abdominal pain.  You develop a severe headache with abdominal pain.  You have abnormal vaginal discharge with abdominal pain.  You have persistent diarrhea.  You have abdominal pain that continues even after rest, or gets  worse. MAKE SURE YOU:   Understand these instructions.  Will watch your condition.  Will get help right away if you are not doing well or get worse. Document Released: 12/08/2005 Document Revised: 09/28/2013 Document Reviewed: 07/07/2013 [facility] Patient Information 2015 [place], [place]. This information is not intended to replace advice given to you by your health care provider. Make sure you discuss any questions you have with your health care provider.  Acetaminophen; Oxycodone tablets What is this medicine? ACETAMINOPHEN; OXYCODONE (a set a MEE noe fen; ox i KOE done) is a pain reliever. It is used to treat mild to moderate pain. This medicine may be used for other purposes; ask your health care provider or pharmacist if you have questions. COMMON BRAND NAME(S): Endocet, Magnacet, Narvox, Percocet, Perloxx, Primalev, Primlev, Roxicet, Xolox What should I tell my health care provider before I take this medicine? They need to know if you have any of these conditions: -brain tumor -Crohn's disease, inflammatory bowel disease, or ulcerative colitis -drug abuse or addiction -head injury -heart or circulation problems -if you often drink alcohol -kidney disease or problems going to the bathroom -liver disease -lung disease, asthma, or breathing problems -an unusual or allergic reaction to acetaminophen, oxycodone, other opioid analgesics, other medicines, foods, dyes, or preservatives -pregnant or trying to get pregnant -breast-feeding How should I use this medicine? Take this medicine by mouth with a full glass of water. Follow the directions on the prescription label. Take your medicine at regular intervals. Do not take your medicine more often than directed. Talk to your pediatrician regarding the use of this medicine in  children. Special care may be needed. Patients over 34 years old may have a stronger reaction and need a smaller dose. Overdosage: If you think you have taken too  much of this medicine contact a poison control center or emergency room at once. NOTE: This medicine is only for you. Do not share this medicine with others. What if I miss a dose? If you miss a dose, take it as soon as you can. If it is almost time for your next dose, take only that dose. Do not take double or extra doses. What may interact with this medicine? -alcohol -antihistamines -barbiturates like amobarbital, butalbital, butabarbital, methohexital, pentobarbital, phenobarbital, thiopental, and secobarbital -benztropine -drugs for bladder problems like solifenacin, trospium, oxybutynin, tolterodine, hyoscyamine, and methscopolamine -drugs for breathing problems like ipratropium and tiotropium -drugs for certain stomach or intestine problems like propantheline, homatropine methylbromide, glycopyrrolate, atropine, belladonna, and dicyclomine -general anesthetics like etomidate, ketamine, nitrous oxide, propofol, desflurane, enflurane, halothane, isoflurane, and sevoflurane -medicines for depression, anxiety, or psychotic disturbances -medicines for sleep -muscle relaxants -naltrexone -narcotic medicines (opiates) for pain -phenothiazines like perphenazine, thioridazine, chlorpromazine, mesoridazine, fluphenazine, prochlorperazine, promazine, and trifluoperazine -scopolamine -tramadol -trihexyphenidyl This list may not describe all possible interactions. Give your health care provider a list of all the medicines, herbs, non-prescription drugs, or dietary supplements you use. Also tell them if you smoke, drink alcohol, or use illegal drugs. Some items may interact with your medicine. What should I watch for while using this medicine? Tell your doctor or health care professional if your pain does not go away, if it gets worse, or if you have new or a different type of pain. You may develop tolerance to the medicine. Tolerance means that you will need a higher dose of the medication for pain  relief. Tolerance is normal and is expected if you take this medicine for a long time. Do not suddenly stop taking your medicine because you may develop a severe reaction. Your body becomes used to the medicine. This does NOT mean you are addicted. Addiction is a behavior related to getting and using a drug for a non-medical reason. If you have pain, you have a medical reason to take pain medicine. Your doctor will tell you how much medicine to take. If your doctor wants you to stop the medicine, the dose will be slowly lowered over time to avoid any side effects. You may get drowsy or dizzy. Do not drive, use machinery, or do anything that needs mental alertness until you know how this medicine affects you. Do not stand or sit up quickly, especially if you are an older patient. This reduces the risk of dizzy or fainting spells. Alcohol may interfere with the effect of this medicine. Avoid alcoholic drinks. There are different types of narcotic medicines (opiates) for pain. If you take more than one type at the same time, you may have more side effects. Give your health care provider a list of all medicines you use. Your doctor will tell you how much medicine to take. Do not take more medicine than directed. Call emergency for help if you have problems breathing. The medicine will cause constipation. Try to have a bowel movement at least every 2 to 3 days. If you do not have a bowel movement for 3 days, call your doctor or health care professional. Do not take Tylenol (acetaminophen) or medicines that have acetaminophen with this medicine. Too much acetaminophen can be very dangerous. Many nonprescription medicines contain acetaminophen. Always read the labels carefully  to avoid taking more acetaminophen. What side effects may I notice from receiving this medicine? Side effects that you should report to your doctor or health care professional as soon as possible: -allergic reactions like skin rash, itching  or hives, swelling of the face, lips, or tongue -breathing difficulties, wheezing -confusion -light headedness or fainting spells -severe stomach pain -unusually weak or tired -yellowing of the skin or the whites of the eyes Side effects that usually do not require medical attention (report to your doctor or health care professional if they continue or are bothersome): -dizziness -drowsiness -nausea -vomiting This list may not describe all possible side effects. Call your doctor for medical advice about side effects. You may report side effects to FDA at 1-800-FDA-1088. Where should I keep my medicine? Keep out of the reach of children. This medicine can be abused. Keep your medicine in a safe place to protect it from theft. Do not share this medicine with anyone. Selling or giving away this medicine is dangerous and against the law. Store at room temperature between 20 and 25 degrees C (68 and 77 degrees F). Keep container tightly closed. Protect from light. This medicine may cause accidental overdose and death if it is taken by other adults, children, or pets. Flush any unused medicine down the toilet to reduce the chance of harm. Do not use the medicine after the expiration date. NOTE: This sheet is a summary. It may not cover all possible information. If you have questions about this medicine, talk to your doctor, pharmacist, or health care provider.  2015, Elsevier/Gold Standard. (2013-08-01 13:17:35)  Ondansetron tablets What is this medicine? ONDANSETRON (on DAN se tron) is used to treat nausea and vomiting caused by chemotherapy. It is also used to prevent or treat nausea and vomiting after surgery. This medicine may be used for other purposes; ask your health care provider or pharmacist if you have questions. COMMON BRAND NAME(S): Zofran What should I tell my health care provider before I take this medicine? They need to know if you have any of these conditions: -heart  disease -history of irregular heartbeat -liver disease -low levels of magnesium or potassium in the blood -an unusual or allergic reaction to ondansetron, granisetron, other medicines, foods, dyes, or preservatives -pregnant or trying to get pregnant -breast-feeding How should I use this medicine? Take this medicine by mouth with a glass of water. Follow the directions on your prescription label. Take your doses at regular intervals. Do not take your medicine more often than directed. Talk to your pediatrician regarding the use of this medicine in children. Special care may be needed. Overdosage: If you think you have taken too much of this medicine contact a poison control center or emergency room at once. NOTE: This medicine is only for you. Do not share this medicine with others. What if I miss a dose? If you miss a dose, take it as soon as you can. If it is almost time for your next dose, take only that dose. Do not take double or extra doses. What may interact with this medicine? Do not take this medicine with any of the following medications: -apomorphine -certain medicines for fungal infections like fluconazole, itraconazole, ketoconazole, posaconazole, voriconazole -cisapride -dofetilide -dronedarone -pimozide -thioridazine -ziprasidone This medicine may also interact with the following medications: -carbamazepine -certain medicines for depression, anxiety, or psychotic disturbances -fentanyl -linezolid -MAOIs like Carbex, Eldepryl, Marplan, Nardil, and Parnate -methylene blue (injected into a vein) -other medicines that prolong the QT interval (  cause an abnormal heart rhythm) -phenytoin -rifampicin -tramadol This list may not describe all possible interactions. Give your health care provider a list of all the medicines, herbs, non-prescription drugs, or dietary supplements you use. Also tell them if you smoke, drink alcohol, or use illegal drugs. Some items may interact  with your medicine. What should I watch for while using this medicine? Check with your doctor or health care professional right away if you have any sign of an allergic reaction. What side effects may I notice from receiving this medicine? Side effects that you should report to your doctor or health care professional as soon as possible: -allergic reactions like skin rash, itching or hives, swelling of the face, lips or tongue -breathing problems -confusion -dizziness -fast or irregular heartbeat -feeling faint or lightheaded, falls -fever and chills -loss of balance or coordination -seizures -sweating -swelling of the hands or feet -tightness in the chest -tremors -unusually weak or tired Side effects that usually do not require medical attention (report to your doctor or health care professional if they continue or are bothersome): -constipation or diarrhea -headache This list may not describe all possible side effects. Call your doctor for medical advice about side effects. You may report side effects to FDA at 1-800-FDA-1088. Where should I keep my medicine? Keep out of the reach of children. Store between 2 and 30 degrees C (36 and 86 degrees F). Throw away any unused medicine after the expiration date. NOTE: This sheet is a summary. It may not cover all possible information. If you have questions about this medicine, talk to your doctor, pharmacist, or health care provider.  2015, Elsevier/Gold Standard. (2013-09-14 16:27:45)  Doxylamine; Pyridoxine delayed-release tablets What is this medicine? Doxylamine; pyridoxine (dox IL a meen; peer i DOX een) is a combination of an antihistamine and vitamin B6. The drug is used to treat nausea and vomiting associated with pregnancy. This medicine may be used for other purposes; ask your health care provider or pharmacist if you have questions. COMMON BRAND NAME(S): Diclegis What should I tell my health care provider before I take this  medicine? They need to know if you have any of these conditions: -contact lenses -glaucoma -liver disease -lung or breathing disease, like asthma or emphysema -pain or trouble passing urine -prostate trouble -ulcers or other stomach problems -an unusual or allergic reaction to doxylamine or pyridoxine (vitamin B6), other medicines, foods, dyes, or preservatives -breast-feeding How should I use this medicine? Take this medicine by mouth with a glass of water. Do not cut, crush or chew this medicine. Follow the directions on the package or prescription label. Take this medicine on an empty stomach. Take your medicine at regular intervals. Do not take it more often than directed. Talk to your pediatrician regarding the use of this medicine in children. Special care may be needed. Overdosage: If you think you've taken too much of this medicine contact a poison control center or emergency room at once. Overdosage: If you think you have taken too much of this medicine contact a poison control center or emergency room at once. NOTE: This medicine is only for you. Do not share this medicine with others. What if I miss a dose? If you miss a dose, take it as soon as you can. If it is almost time for your next dose, take only that dose. Do not take double or extra doses. What may interact with this medicine? -alcohol -atropine -antihistamines for allergy, cough and cold -certain medicines for  bladder problems like oxybutynin, tolterodine -certain medicines for stomach problems like dicyclomine, hyoscyamine -certain medicines for travel sickness like scopolamine -certain medicines for Parkinson's disease like benztropine, trihexyphenidyl -ipratropium -MAOIs like Carbex, Eldepryl, Marplan, Nardil, and Parnate This list may not describe all possible interactions. Give your health care provider a list of all the medicines, herbs, non-prescription drugs, or dietary supplements you use. Also tell them if  you smoke, drink alcohol, or use illegal drugs. Some items may interact with your medicine. What should I watch for while using this medicine? Tell your doctor or healthcare professional if your symptoms do not start to get better or if they get worse. See your doctor right away if you get a high fever or have problems breathing. Your mouth may get dry. Chewing sugarless gum or sucking hard candy, and drinking plenty of water may help. Contact your doctor if the problem does not go away or is severe. This medicine may cause dry eyes and blurred vision. If you wear contact lenses you may feel some discomfort. Lubricating drops may help. See your eye doctor if the problem does not go away or is severe. You may get drowsy or dizzy. Do not drive, use machinery, or do anything that needs mental alertness until you know how this medicine affects you. Do not stand or sit up quickly, especially if you are an older patient. This reduces the risk of dizzy or fainting spells. What side effects may I notice from receiving this medicine? Side effects that you should report to your doctor or health care professional as soon as possible: -allergic reactions like skin rash, itching or hives, swelling of the face, lips, or tongue -changes in vision -confused, agitated, or nervous -fast, irregular heartbeat -feeling faint or lightheaded, falls -muscle or facial twitches -seizure -trouble passing urine or change in the amount of urine Side effects that usually do not require medical attention (Report these to your doctor or health care professional if they continue or are bothersome.): -dry mouth -headache -loss of appetite -stomach upset This list may not describe all possible side effects. Call your doctor for medical advice about side effects. You may report side effects to FDA at 1-800-FDA-1088. Where should I keep my medicine? Keep out of the reach of children. Store at room temperature between 15 and 30  degrees C (59 and 86 degrees F). Keep bottle tightly closed and protect from moisture. Do not remove desiccant canister from bottle. Throw away any unused medicine after the expiration date. NOTE: This sheet is a summary. It may not cover all possible information. If you have questions about this medicine, talk to your doctor, pharmacist, or health care provider.  2015, Elsevier/Gold Standard. (2013-04-15 09:50:32)  Prenatal Vitamin and Mineral Combinations (oral solid dosage forms) What is this medicine? PRENATAL VITAMIN AND MINERAL combinations are used before, during, and after pregnancy to help provide provide good nutrition. This medicine may be used for other purposes; ask your health care provider or pharmacist if you have questions. COMMON BRAND NAME(S): Active OB, Advanced Care Plus, Advanced NatalCare, Advanced-RF NatalCare, Aminate Fe, Anemagen OB, B-Nexa, BP FoliNatal Plus B, BP MultiNatal Plus, BP MultiNatal Plus Chewable, BP Prenate, Brainstrong, Bright Beginnings Prenatal, Cal-Nate, Calcium PNV, CareNatal DHA, CareNate 600, Cavan One Omega, Cavan Prenatal with EC Calcium, Cavan-Alpha, Cavan-EC SOD DHA, Cavan-Heme OB, Cavan-Heme Omega, Cenogen Ultra, Centrum Specialist Prenatal, CertaVite with Antioxidants, Choice-OB + DHA, Citracal Prenatal, Citracal Prenatal + DHA, CitraNatal 90 DHA, CitraNatal Assure, CitraNatal B-Calm, CitraNatal DHA, CitraNatal  Harmony, CitraNatal Rx, Classic Prenatal, ComBi Rx, Complete Natal DHA, Complete-RF, CompleteNate, Concept DHA, Concept OB, CoreNate-DHA, Corvite FE with Quatrefolic, CRNatal DHA, Daily Vitamin, Docosavit, Duet, Duet Chewable, Duet DHA, Duet DHA 400, Duet DHA 430ec, Duet DHA Balanced, Duet DHA Complete, Duet DHA Complete Gluten Free, Duet DHA EC, Duet DHA Ferrazone, EC Omega-3, DuoVit DHA, Edge OB, Elite OB, Elite OB with DHA, Elite-OB 400, Extra-Virt Plus, Femecal OB, Femecal OB Plus DHA, Ferrocite Plus, Folbecal, Folcal DHA, [address] One,  [place]ains [employer] 3, [place] One with [place], Folivane OB, Folivane-EC Calcium DHA NF, Foltabs 90 Plus DHA, Foltabs Prenatal, Foltabs Prenatal Plus DHA, Gesticare, Gesticare DHA, Gesticare DHA Delayed-Release, HemeNatal OB, HemeNatal OB + DHA, Hemocyte Plus, HIP Prenatal, ICAR Prenatal Rx, iNatal Advance, iNatal GT, iNatal Ultra, Infanate Balance, Infanate DHA, Infanate Plus, Kolnatal, Lactocal-F, Levomefolate PNV, MACNATAL CN DHA, Marnatal-F, Marnatal-F Plus, Materna, Maxinate, [place] Prenatal, Mission Prenatal F.A., [place] Prenatal H.P., Mom's Choice Rx, Multi-Nate 30, Multi-Nate 30 DHA, Multi-Nate DHA Extra, Multifol Plus, Multivitamin With Minerals, MyNatal OB Prenatal, NataCaps, NataChew, NataFolic-OB, Natafort, Natal-V RX, NatalCare CFe 60, NatalCare GlossTabs, NatalCare PIC, [employer], [employer], [employer], [address] Three, NATALVIRT 90 DHA, NATALVIRT CA, NataTab CFe, NataTab FA, Natatab Rx, Natelle, Natelle C, Natelle One, [employer] with DHA, Natelle Prefer, Natelle-ez, Navatab + DHA, Neevo, Neevo DHA, Nestabs, Nestabs ABC, Nestabs CBF, Nestabs DHA, Nestabs FA, Nestabs Rx, New Advanced Formula Prenatal Z, Nexa Plus, Nexa Select, Niferex-PN, Niferex-PN Forte, NovaNatal, NovaStart, Nu-Natal, NutraCare, Nutri-Tab OB, Nutri-Tab OB + DHA, Nutrinate, NutriSpire, O-Cal F.A., O-Cal Prenatal, OB Choice, OB Complete, OB Complete 400, OB Complete One, OB Complete Petite, OB Complete Premier, OB Complete with DHA, OB-Natal One, Obstetrix-100, Obtrex, Obtrex DHA, One-A-Day Women's, One-A-Day Women's Prenatal, OptiNate, Paire OB Tablet Plus DHA, PNV OB + DHA, PNV Prenatal, PNV Prenatal Plus Multivitamin, PNV Tabs 29-1, PNV-DHA, PNV-DHA + Docusate, PNV-DHA Plus, PNV-First, PNV-Iron, PNV-OB with DHA, PNV-Omega, PNV-Select, PNV-Total with DHA, PNV-VP-U, [place] 400, PR [address] 400ec, PR [address] 430, PR [address] 430ec, [place] 440ec, PreCare, PreferaOB, PreferaOB + DHA, PreferaOB One, Premesis Rx, [place], [address] Plus, [address], [place], [place], Prenaissance 90 DHA, Prenaissance Balance, Prenaissance DHA, Prenaissance Harmony DHA, Prenaissance Next, Prenaissance Plus, Prenaissance Promise, PrenaPlus, Prenat with Quatrefolic, PreNata Multivitamin with Iron, Prenatabs CBF, Prenatabs FA, Prenatabs OBN, Prenatabs RX, Prenatal, Prenatal 1 Plus 1, Prenatal 19, Prenatal AD, Prenatal Formula 3, Prenatal H, Prenatal Low Iron, Prenatal MR 20 Fe, Prenatal MTR with Selenium, Prenatal Multivitamin + DHA 2, Prenatal Optima Advance, Prenatal Plus, Prenatal Plus Iron, Prenatal Plus Low Iron, Prenatal Rx with Beta Carotene, Prenatal S, Prenatal U, Prenatal Vitamin, PreNatal Vitamins Plus, Prenate Advance, Prenate AM with Quatrefolic, Prenate DHA, Prenate Elite, Prenate Enhance with [employer], Prenate Essential, Prenate GT, Prenate Mini, PreNate Plus, Prenate Restore with [employer], [employer], Prenate Ultra, Prenavite, Prenavite Protein, PreNexa, Yahoo[employer] Inc, PrePLUS, PreQue, PreTAB, Previte Rx, PrimaCare, [employer], PrimaCare ONE, Provida OB, PruEt DHA, PruEt DHAec, PureFe OB Plus, PureFe Plus, PureVit DualFe Plus, RE DualVit OB, RE DualVit Plus, RE OB + DHA, RE OB 90 + DHA, RE Prenatal, RE PreVit + DHA, RE-Nata 29, RE-Nata 29 OB, Reaphrim, Renate, Renate DHA, Renate DHA Extra, REocyte Plus, Right Step, Rovin-Nv, Rovin-Nv DHA, Se-Care, Se-Care Conceive, Se-Care Gesture, Se-Natal 19, Se-Natal 19 Chewable, Se-Natal 90, Se-Natal ONE, Se-Plete DHA, Se-Tan DHA, Se-Tan Plus, Select-OB, Select-OB + DHA, SetonET, SetonET-EC DHA, StrongStart, StrongStart Chewable Tablet, Lu [place] One, [employer] Energy, Stuart Prenatal + DHA, Stuartnatal Plus 3, Tandem DHA, Tandem  OB, Tandem Plus, Taron A Prenatal Pack with DHA, Taron EC Calcium DHA Pack, Taron Prenatal with DHA, Taron-C DHA, Taron-EC Cal, Taron-Prex Prenatal with DHA, Thera [place] Complete, Thera [place] Core Nutrition, Thera [place] Lactation Support, Thera [address]  OvaVite, Thera NIKE, [place], TL-Care DHA, TL-Select, TL-Select DHA, Tri Rx, [employer], TriCare Prenatal DHA ONE, Trifera OB, Trimesis Rx, [employer], [address] Rx 1, Trinate, Triveen-Duo DHA, Triveen-PRx RNF, Triveen-Ten, Trust [employer], UltimateCare Advantage, Economist Combo, Economist ONE, UltimateCare ONE NF, Coca Cola, VemaVite-PRx 2, Vena-Bal DHA, Venatal-FA, Verotin-BY, Verotin-GR, Vinacal, Vinacal B, Vinatal Forte, Vinate 90, Vinate AZ, Vinate AZ Extra, Vinate C, Vinate Calcium, Vinate Care, Vinate DHA, Vinate GT, Vinate IC, [place] II, [place] III, Vinate M Low Iron, Vinate One, Vinate PN, Vinate Ultra, Virt [place], Virt-Advance, Virt-C DHA, Virt-Care One, Virt-PN, Virt-PN DHA, Virt-PN Plus, Virt-Select, Virt-Vite GT, VirtPrex, Vitafol PN, Vitafol Ultra, Vitafol-Nano Prenatal, Vitafol-OB, Vitafol-OB + DHA, Vitafol-OB and DHA, Vitafol-One, VitaMed MD Plus Rx, VitaNatal OB Plus DHA, vitaPearl Prenatal, VitaPhil, VitaPhil + DHA, VitaPhil + DHA 90, VitaPhil AiDE, VitaSpire, Viva CT, VIVA DHA, Vol-Tab Rx, VP CH Ultra, VP-CH-PNV, VP-GGR-B6 Prenatal, VP-HEME OB, VP-HEME OB+ DHA, VP-PNV-DHA, Zatean-CH, Zatean-Pn, Zatean-Pn DHA, Zatean-Pn Plus, Zingiber What should I tell my health care provider before I take this medicine? They need to know if you have any of these conditions: -bleeding or clotting disorder -history of anemia of any type -other chronic health condition -an unusual or allergic reaction to vitamins, minerals, other medicines, foods, dyes, or preservatives How should I use this medicine? Take this medicine by mouth with a glass of water. You can take it with or without food. If it upsets your stomach, take it with food. Chewable prenatal vitamin tablets may be chewed completely before swallowing. Follow the directions on the prescription label. The usual dose is taken once a day. Do not take your medicine more often than directed. Contact your pediatrician  regarding the use of this medicine in children. Special care may be needed. This medicine is intended for females who are pregnant, breast-feeding, or may become pregnant. Overdosage: If you think you have taken too much of this medicine contact a poison control center or emergency room at once. NOTE: This medicine is only for you. Do not share this medicine with others. What if I miss a dose? If you miss a dose, take it as soon as you can. If it is almost time for your next dose, take only that dose. Do not take double or extra doses. What may interact with this medicine? -alendronate -antacids -cefdinir -cefditoren -etidronate -fluoroquinolone antibiotics (examples: ciprofloxacin, gatifloxacin, levofloxacin) -ibandronate -levodopa -risedronate -tetracycline antibiotics (examples: doxycycline, minocycline, tetracycline) -thyroid hormones -warfarin This list may not describe all possible interactions. Give your health care provider a list of all the medicines, herbs, non-prescription drugs, or dietary supplements you use. Also tell them if you smoke, drink alcohol, or use illegal drugs. Some items may interact with your medicine. What should I watch for while using this medicine? See your health care professional for regular checks on your progress. Remember that vitamin and mineral supplements do not replace the need for good nutrition from a balanced diet. Stools commonly change color when vitamins and minerals are taken. Notify your health care professional if this change is alarming or accompanied by other symptoms, like abdominal pain. What side effects may I notice from receiving this medicine? Side effects that you should report to your doctor or health care professional as soon as possible: -  allergic reaction such as skin rash or difficulty breathing -vomiting Side effects that usually do not require medical attention (report to your doctor or health care professional if they  continue or are bothersome): -nausea -stomach upset This list may not describe all possible side effects. Call your doctor for medical advice about side effects. You may report side effects to FDA at 1-800-FDA-1088. Where should I keep my medicine? Keep out of the reach of children. Most vitamins and minerals should be stored at controlled room temperature. Check your specific product directions. Protect from heat and moisture. Throw away any unused medicine after the expiration date. NOTE: This sheet is a summary. It may not cover all possible information. If you have questions about this medicine, talk to your doctor, pharmacist, or health care provider.  2015, Elsevier/Gold Standard. (2014-04-04 08:34:45)

## 2015-06-18 NOTE — ED Provider Notes (Signed)
CSN: [id number]     Arrival date & time 06/18/15  0003 History   First MD Initiated Contact with Patient 06/18/15 0033     This chart was scribed for Dione Booze, MD by Arlan Organ, ED Scribe. This patient was seen in room APA10/APA10 and the patient's care was started 12:37 AM.   Chief Complaint  Patient presents with  . Abdominal Pain   The history is provided by the patient. No language interpreter was used.    HPI Comments: KATERA RYBKA, [id number]  is a 34 y.o. female with a PMHx of Crohn's disease and IBS who presents to the Emergency Department complaining of constant, ongoing, unchanged suprapubic abdominal pain; L greater than R x 2 days. Currently pain rated 8/10. Pain is made worse with ambulation, palpation, and laying flat. Discomfort is mildly alleviated when curling up in a ball. She also reports ongoing diarrhea and nausea since time of onset of abdominal pain. OTC Tylenol and prescribed Phenergan attempted for symptoms without any noticeable improvement. No recent fever or chills. No vaginal bleeding. Pt states she recently took 2 at home pregnancy tests with positive results. LNMP May 2016. Pt with known allergies to Sulfa Antibiotics and Naproxen.  Past Medical History  Diagnosis Date  . IBS (irritable bowel syndrome)   . Crohn disease    Past Surgical History  Procedure Laterality Date  . Tonsillectomy    . Tubal ligation     Family History  Problem Relation Age of Onset  . Depression Mother   . Depression Father   . Heart disease Father   . Alcohol abuse Father   . Hypertension Father   . Anesthesia problems Neg Hx   . Hypotension Neg Hx   . Malignant hyperthermia Neg Hx   . Pseudochol deficiency Neg Hx   . Alcohol abuse Paternal Uncle   . Diabetes Maternal Grandfather   . Alcohol abuse Paternal Grandmother   . Alcohol abuse Paternal Grandfather   . Cancer Paternal Grandfather     lung   History  Substance Use Topics  . Smoking status: Current Every  Day Smoker -- 0.25 packs/day  . Smokeless tobacco: Never Used  . Alcohol Use: No   OB History    Gravida Para Term Preterm AB TAB SAB Ectopic Multiple Living   4 3 3  0 1 0 1 0 0 3     Review of Systems  Constitutional: Negative for fever and chills.  Respiratory: Negative for cough and shortness of breath.   Cardiovascular: Negative for chest pain.  Gastrointestinal: Positive for nausea, abdominal pain and diarrhea. Negative for vomiting.  Genitourinary: Negative for vaginal bleeding and vaginal discharge.  All other systems reviewed and are negative.     Allergies  Naproxen and Sulfa antibiotics  Home Medications   Prior to Admission medications   Medication Sig Start Date End Date Taking? Authorizing Provider  acetaminophen (TYLENOL) 500 MG tablet Take 1,000 mg by mouth every 6 (six) hours as needed for mild pain, moderate pain or fever.   Yes Historical Provider, MD  promethazine (PHENERGAN) 25 MG tablet Take 25 mg by mouth every 6 (six) hours as needed for nausea or vomiting.   Yes Historical Provider, MD  cetirizine (ZYRTEC) 10 MG tablet Take 10 mg by mouth daily.    Historical Provider, MD  HYDROcodone-acetaminophen (NORCO/VICODIN) 5-325 MG per tablet Take 1-2 tablets by mouth every 6 (six) hours as needed for moderate pain (cough). 11/19/13   Benjiman Core, MD  ibuprofen (ADVIL,MOTRIN) 200 MG tablet Take 400-600 mg by mouth every 6 (six) hours as needed for fever, mild pain or moderate pain.     Historical Provider, MD  omeprazole (PRILOSEC OTC) 20 MG tablet Take 20 mg by mouth daily.    Historical Provider, MD   Triage Vitals: BP 119/82 mmHg  Pulse 91  Temp(Src) 98.5 F (36.9 C)  Resp 20  Ht  (1.6 m)  Wt 127 lb (57.607 kg)  BMI 22.50 kg/m2  SpO2 99%  LMP 05/10/2015   Physical Exam  Constitutional: She is oriented to person, place, and time. She appears well-developed and well-nourished. No distress.  HENT:  Head: Normocephalic and atraumatic.  Eyes:  EOM are normal. Pupils are equal, round, and reactive to light.  Neck: Normal range of motion. Neck supple. No JVD present.  Cardiovascular: Normal rate, regular rhythm and normal heart sounds.   Pulmonary/Chest: Effort normal and breath sounds normal.  Abdominal: Soft. She exhibits no distension and no mass. There is no rebound and no guarding.  moderate tenderness across suprapubic region Bowel sounds decreased   Genitourinary:  Normal external female genitalia. Cervix is closed with small amount of clear, mucoid drainage. There is diffuse tenderness on bimanual examination including cervical motion tenderness. No definite adnexal masses. Difficult to assess fundal size.  Musculoskeletal: Normal range of motion. She exhibits no edema.  Lymphadenopathy:    She has no cervical adenopathy.  Neurological: She is alert and oriented to person, place, and time. No cranial nerve deficit. She exhibits normal muscle tone. Coordination normal.  Skin: Skin is warm and dry. No rash noted.  Psychiatric: She has a normal mood and affect. Her behavior is normal. Judgment and thought content normal.  Nursing note and vitals reviewed.   ED Course  Procedures (including critical care time)  DIAGNOSTIC STUDIES: Oxygen Saturation is 99% on RA, Normal by my interpretation.    COORDINATION OF CARE: 12:43 AM- Will give fluids, Zofran, and Dilaudid. Will order RPR, hCG, HIV antibody, BMP, CBC, I-sat CG4 lactic acid, pregnancy urine, and urinalysis. Discussed treatment plan with pt at bedside and pt agreed to plan.     Labs Review Results for orders placed or performed during the hospital encounter of 06/18/15  Pregnancy, urine  Result Value Ref Range   Preg Test, Ur POSITIVE (A) NEGATIVE  Urinalysis, Routine w reflex microscopic (not at [facility])  Result Value Ref Range   Color, Urine YELLOW YELLOW   APPearance CLEAR CLEAR   Specific Gravity, Urine 1.020 1.005 - 1.030   pH 6.0 5.0 - 8.0   Glucose, UA  NEGATIVE NEGATIVE mg/dL   Hgb urine dipstick NEGATIVE NEGATIVE   Bilirubin Urine NEGATIVE NEGATIVE   Ketones, ur NEGATIVE NEGATIVE mg/dL   Protein, ur NEGATIVE NEGATIVE mg/dL   Urobilinogen, UA 0.2 0.0 - 1.0 mg/dL   Nitrite NEGATIVE NEGATIVE   Leukocytes, UA NEGATIVE NEGATIVE    Imaging Review US Ob Comp Less 14 Wks  06/18/2015   CLINICAL DATA:  Pelvic pain, left greater than right. Four days duration.  EXAM: OBSTETRIC <14 WK [place] AND TRANSVAGINAL OB US  TECHNIQUE: Both transabdominal and transvaginal ultrasound examinations were performed for complete evaluation of the gestation as well as the maternal uterus, adnexal regions, and pelvic cul-de-sac. Transvaginal technique was performed to assess early pregnancy.  COMPARISON:  None.  FINDINGS: Intrauterine gestational sac: Single  Yolk sac:  No  Embryo:  No  Cardiac Activity: No  MSD: 4.3  mm  5 w   1  d  [place] EDC: 02/17/2016  Maternal uterus/adnexae: 3.9 cm area of altered echogenicity in the anterior fundus, possibly a fibroid. Ovaries appear normal. No free pelvic fluid.  IMPRESSION: Intrauterine sac, too small to characterize. Recommend follow-up quantitative B-HCG levels and follow-up US in 14 days to confirm and assess viability. This recommendation follows SRU consensus guidelines: Diagnostic Criteria for Nonviable Pregnancy Early in the First Trimester. Malva Limes Med 2013; 161:0960-45.   Electronically Signed   By: Ellery Plunk M.D.   On: 06/18/2015 04:15   US Ob Transvaginal  06/18/2015   CLINICAL DATA:  Pelvic pain, left greater than right. Four days duration.  EXAM: OBSTETRIC <14 WK [place] AND TRANSVAGINAL OB US  TECHNIQUE: Both transabdominal and transvaginal ultrasound examinations were performed for complete evaluation of the gestation as well as the maternal uterus, adnexal regions, and pelvic cul-de-sac. Transvaginal technique was performed to assess early pregnancy.  COMPARISON:  None.  FINDINGS: Intrauterine gestational sac: Single   Yolk sac:  No  Embryo:  No  Cardiac Activity: No  MSD: 4.3  mm   5 w   1  d  [place] EDC: 02/17/2016  Maternal uterus/adnexae: 3.9 cm area of altered echogenicity in the anterior fundus, possibly a fibroid. Ovaries appear normal. No free pelvic fluid.  IMPRESSION: Intrauterine sac, too small to characterize. Recommend follow-up quantitative B-HCG levels and follow-up US in 14 days to confirm and assess viability. This recommendation follows SRU consensus guidelines: Diagnostic Criteria for Nonviable Pregnancy Early in the First Trimester. Malva Limes Med 2013; 409:8119-14.   Electronically Signed   By: Ellery Plunk M.D.   On: 06/18/2015 04:15   Images viewed by me.  MDM   Final diagnoses:  Pelvic pain affecting pregnancy in first trimester, antepartum    Pelvic pain in early pregnancy in women with history of ectopic pregnancy. She states that she had a left-sided ectopic pregnancy but the tube was not removed. HCG level will need to be checked and, if greater than 1000, will need to send for pelvic ultrasound.  HCG came back just under 1000 so she was sent for pelvic ultrasound which appeared to show an intrauterine pregnancy of 5 weeks 1 day with [facility] February 26. Patient required several doses of hydromorphone for pain control. There is no evidence of any free fluid or any pathology to explain her pain. She is discharged with prescriptions for oxycodone have acetaminophen for pain, ondansetron for nausea, doxylamine-pyridoxine for nausea, and prenatal vitamins. She is referred to GYN for follow-up hCG level in 3 days. Repeat ultrasound is recommended in 2 weeks.  I personally performed the services described in this documentation, which was scribed in my presence. The recorded information has been reviewed and is accurate.        Dione Booze, MD 06/18/15 [phone]

## 2015-06-18 NOTE — ED Notes (Signed)
Pt reports abdominal pain for the past 2 days. Says she has taken 2 pregnancy test that were positive. Had a tubal pregnancy in the past.

## 2015-06-18 NOTE — ED Notes (Signed)
Pt left ED, ambulatory with no signs of distress. Pt verbalized discharge instructions. 

## 2015-06-19 LAB — RPR: RPR Ser Ql: NONREACTIVE

## 2015-06-19 LAB — GC/CHLAMYDIA PROBE AMP (~~LOC~~) NOT AT ARMC
CHLAMYDIA, DNA PROBE: NEGATIVE
NEISSERIA GONORRHEA: NEGATIVE

## 2015-06-19 LAB — HIV ANTIBODY (ROUTINE TESTING W REFLEX): HIV Screen 4th Generation wRfx: NONREACTIVE

## 2015-06-20 ENCOUNTER — Ambulatory Visit (INDEPENDENT_AMBULATORY_CARE_PROVIDER_SITE_OTHER): Payer: Medicaid Other | Admitting: Advanced Practice Midwife

## 2015-06-20 ENCOUNTER — Encounter: Payer: Self-pay | Admitting: Advanced Practice Midwife

## 2015-06-20 VITALS — BP 128/80 | HR 84 | Ht 63.0 in | Wt 153.0 lb

## 2015-06-20 DIAGNOSIS — O3680X1 Pregnancy with inconclusive fetal viability, fetus 1: Secondary | ICD-10-CM

## 2015-06-20 DIAGNOSIS — R102 Pelvic and perineal pain: Secondary | ICD-10-CM

## 2015-06-20 MED ORDER — DOXYLAMINE-PYRIDOXINE 10-10 MG PO TBEC: 2.0000 | DELAYED_RELEASE_TABLET | Freq: Every day | ORAL | Status: DC

## 2015-06-20 NOTE — Progress Notes (Signed)
[facility] Visit  Patient name: Mary Lowe MRN [id number]  Date of birth: [birth year]  CC & HPI:  Mary Lowe is a 34 y.o. Caucasian female presenting today for F/U from ED visit 6/27. She went to ED with pelvic pain.  Saw GS on [place], nothing in it. HCG 985. Here today for F/U labs.  Feels better, pain better.  Was RX'd percocet/zofran/diclegis. Has not been taking any of it.   Pertinent History Reviewed:  Medical & Surgical Hx:   Past Medical History  Diagnosis Date  . IBS (irritable bowel syndrome)   . Crohn disease    Past Surgical History  Procedure Laterality Date  . Tonsillectomy    . Tubal ligation     Family History  Problem Relation Age of Onset  . Depression Mother   . Depression Father   . Heart disease Father   . Alcohol abuse Father   . Hypertension Father   . Anesthesia problems Neg Hx   . Hypotension Neg Hx   . Malignant hyperthermia Neg Hx   . Pseudochol deficiency Neg Hx   . Alcohol abuse Paternal Uncle   . Diabetes Maternal Grandfather   . Alcohol abuse Paternal Grandmother   . Alcohol abuse Paternal Grandfather   . Cancer Paternal Grandfather     lung    Current outpatient prescriptions:  .  ondansetron (ZOFRAN) 4 MG tablet, Take 1 tablet (4 mg total) by mouth every 6 (six) hours as needed for nausea., Disp: 20 tablet, Rfl: 0 .  Prenatal Multivit-Min-Fe-FA (PRENATAL VITAMINS) 0.8 MG tablet, Take 1 tablet by mouth daily., Disp: 30 tablet, Rfl: 0 .  acetaminophen (TYLENOL) 500 MG tablet, Take 1,000 mg by mouth every 6 (six) hours as needed for mild pain, moderate pain or fever., Disp: , Rfl:  .  cetirizine (ZYRTEC) 10 MG tablet, Take 10 mg by mouth daily., Disp: , Rfl:  .  Doxylamine-Pyridoxine 10-10 MG TBEC, Take 2 tablets by mouth at bedtime. (Patient not taking: Reported on 06/20/2015), Disp: 60 tablet, Rfl: 0 .  HYDROcodone-acetaminophen (NORCO/VICODIN) 5-325 MG per tablet, Take 1-2 tablets by mouth every 6 (six) hours as needed  for moderate pain (cough). (Patient not taking: Reported on 06/20/2015), Disp: 12 tablet, Rfl: 0 .  ibuprofen (ADVIL,MOTRIN) 200 MG tablet, Take 400-600 mg by mouth every 6 (six) hours as needed for fever, mild pain or moderate pain. , Disp: , Rfl:  .  omeprazole (PRILOSEC OTC) 20 MG tablet, Take 20 mg by mouth daily., Disp: , Rfl:  .  oxyCODONE-acetaminophen (PERCOCET/ROXICET) 5-325 MG per tablet, Take 1 tablet by mouth every 4 (four) hours as needed. (Patient not taking: Reported on 06/20/2015), Disp: 15 tablet, Rfl: 0 .  promethazine (PHENERGAN) 25 MG tablet, Take 25 mg by mouth every 6 (six) hours as needed for nausea or vomiting., Disp: , Rfl:  Social History: Reviewed -  reports that she has been smoking Cigarettes.  She has been smoking about 0.50 packs per day. She has never used smokeless tobacco.  Review of Systems:   Constitutional: Negative for fever and chills Eyes: Negative for visual disturbances Respiratory: Negative for shortness of breath, dyspnea Cardiovascular: Negative for chest pain or palpitations  Gastrointestinal: Negative for vomiting, diarrhea and constipation; no abdominal pain Genitourinary: Negative for dysuria and urgency, vaginal irritation or itching Musculoskeletal: Negative for back pain, joint pain, myalgias  Neurological: Negative for dizziness and headaches    Objective Findings:  Vitals: BP 128/80 mmHg  Pulse 84  Ht 5\' 3"  (1.6 m)  Wt 153 lb (69.4 kg)  BMI 27.11 kg/m2  LMP 05/10/2015  Physical Examination: General appearance - alert, well appearing, and in no distress Mental status - alert, oriented to person, place, and time Chest - normal resp effort Abdomen - nontenter, no pain Musculoskeletal - no muscular tenderness noted Extremities - no pedal edema noted  No results found for this or any previous visit (from the past 24 hour(s)).    Assessment & Plan:  A:   Pregnancy of uncertain viability  Do not take zofran or percocet.  Rx'd  diclegis and prior auth sent P:  QHCG today    F/U 10 days for [place]   CRESENZO-DISHMAN,Crescentia Boutwell CNM 06/20/2015 11:06 AM

## 2015-06-20 NOTE — Addendum Note (Signed)
Addended by: Richardson Chiquito on: 06/20/2015 11:54 AM   Modules accepted: Orders

## 2015-06-21 ENCOUNTER — Telehealth: Payer: Self-pay | Admitting: Advanced Practice Midwife

## 2015-06-21 LAB — BETA HCG QUANT (REF LAB): HCG QUANT: 2750 m[IU]/mL

## 2015-06-21 MED ORDER — DOXYLAMINE-PYRIDOXINE 10-10 MG PO TBEC: 10.0000 mg | DELAYED_RELEASE_TABLET | ORAL | Status: DC

## 2015-06-21 NOTE — Telephone Encounter (Signed)
Pt informed of QHCG 2750, did double since 06/18/2015. Pt requesting samples of Diclegis until she can get her MCD card, cost of Diclegis without Card $400.00. Samples left at front desk.

## 2015-06-24 ENCOUNTER — Inpatient Hospital Stay (HOSPITAL_COMMUNITY): Payer: Medicaid Other

## 2015-06-24 ENCOUNTER — Inpatient Hospital Stay (HOSPITAL_COMMUNITY)
Admission: AD | Admit: 2015-06-24 | Discharge: 2015-06-24 | Disposition: A | Discharge status: Home / Self Care | Payer: Medicaid Other | Source: Ambulatory Visit | Attending: Obstetrics & Gynecology | Admitting: Obstetrics & Gynecology

## 2015-06-24 ENCOUNTER — Encounter (HOSPITAL_COMMUNITY): Payer: Self-pay | Admitting: *Deleted

## 2015-06-24 DIAGNOSIS — K589 Irritable bowel syndrome without diarrhea: Secondary | ICD-10-CM | POA: Insufficient documentation

## 2015-06-24 DIAGNOSIS — R197 Diarrhea, unspecified: Secondary | ICD-10-CM

## 2015-06-24 DIAGNOSIS — R109 Unspecified abdominal pain: Secondary | ICD-10-CM

## 2015-06-24 DIAGNOSIS — F17210 Nicotine dependence, cigarettes, uncomplicated: Secondary | ICD-10-CM | POA: Insufficient documentation

## 2015-06-24 DIAGNOSIS — K5090 Crohn's disease, unspecified, without complications: Secondary | ICD-10-CM | POA: Diagnosis not present

## 2015-06-24 DIAGNOSIS — Z3A01 Less than 8 weeks gestation of pregnancy: Secondary | ICD-10-CM | POA: Diagnosis not present

## 2015-06-24 DIAGNOSIS — R1110 Vomiting, unspecified: Secondary | ICD-10-CM | POA: Diagnosis not present

## 2015-06-24 DIAGNOSIS — R42 Dizziness and giddiness: Secondary | ICD-10-CM

## 2015-06-24 DIAGNOSIS — O99331 Smoking (tobacco) complicating pregnancy, first trimester: Secondary | ICD-10-CM | POA: Diagnosis not present

## 2015-06-24 DIAGNOSIS — O210 Mild hyperemesis gravidarum: Secondary | ICD-10-CM | POA: Insufficient documentation

## 2015-06-24 DIAGNOSIS — O26899 Other specified pregnancy related conditions, unspecified trimester: Secondary | ICD-10-CM

## 2015-06-24 LAB — CBC
HCT: 38.7 % (ref 36.0–46.0)
Hemoglobin: 13.3 g/dL (ref 12.0–15.0)
MCH: 30.9 pg (ref 26.0–34.0)
MCHC: 34.4 g/dL (ref 30.0–36.0)
MCV: 90 fL (ref 78.0–100.0)
Platelets: 248 10*3/uL (ref 150–400)
RBC: 4.3 MIL/uL (ref 3.87–5.11)
RDW: 13.6 % (ref 11.5–15.5)
WBC: 18 10*3/uL — ABNORMAL HIGH (ref 4.0–10.5)

## 2015-06-24 LAB — URINALYSIS, ROUTINE W REFLEX MICROSCOPIC
BILIRUBIN URINE: NEGATIVE
Glucose, UA: NEGATIVE mg/dL
HGB URINE DIPSTICK: NEGATIVE
KETONES UR: NEGATIVE mg/dL
LEUKOCYTES UA: NEGATIVE
NITRITE: NEGATIVE
PH: 8 (ref 5.0–8.0)
Protein, ur: NEGATIVE mg/dL
Specific Gravity, Urine: 1.015 (ref 1.005–1.030)
Urobilinogen, UA: 0.2 mg/dL (ref 0.0–1.0)

## 2015-06-24 LAB — HCG, QUANTITATIVE, PREGNANCY: hCG, Beta Chain, Quant, S: 12509 m[IU]/mL — ABNORMAL HIGH (ref <5)

## 2015-06-24 MED ORDER — PROCHLORPERAZINE MALEATE 10 MG PO TABS: 10.0000 mg | ORAL_TABLET | Freq: Two times a day (BID) | ORAL | Status: DC | PRN

## 2015-06-24 MED ORDER — MECLIZINE HCL 25 MG PO TABS: 25.0000 mg | ORAL_TABLET | Freq: Three times a day (TID) | ORAL | Status: DC | PRN

## 2015-06-24 MED ORDER — PROMETHAZINE HCL 25 MG/ML IJ SOLN
25.0000 mg | Freq: Once | INTRAMUSCULAR | Status: AC
  Administered 2015-06-24: 25 mg via INTRAVENOUS
  Filled 2015-06-24: qty 1

## 2015-06-24 MED ORDER — PROMETHAZINE HCL 25 MG RE SUPP: 25.0000 mg | Freq: Four times a day (QID) | RECTAL | Status: DC | PRN

## 2015-06-24 MED ORDER — PROMETHAZINE HCL 25 MG/ML IJ SOLN
25.0000 mg | Freq: Once | INTRAVENOUS | Status: AC
  Administered 2015-06-24: 25 mg via INTRAVENOUS
  Filled 2015-06-24: qty 1

## 2015-06-24 MED ORDER — LACTATED RINGERS IV BOLUS (SEPSIS)
1000.0000 mL | Freq: Once | INTRAVENOUS | Status: AC
  Administered 2015-06-24: 1000 mL via INTRAVENOUS

## 2015-06-24 NOTE — MAU Note (Signed)
Pt presents to MAU with complaints of nausea, vomiting, diarrhea since this morning. States that she has had morning sickness but this is worse this morning. Denies any vaginal bleeding

## 2015-06-24 NOTE — MAU Provider Note (Signed)
History     CSN: [id number]  Arrival date and time: 06/24/15 1217   First Provider Initiated Contact with Patient 06/24/15 1416      Chief Complaint  Patient presents with  . Nausea  . Emesis  . Diarrhea   Emesis  Associated symptoms include abdominal pain, chills, diarrhea and dizziness.  Diarrhea  Associated symptoms include abdominal pain, chills and vomiting.    Mary Lowe is a 34 y.o. [phone] at [redacted]w[redacted]d. She presents with nausea and vomiting off/on x 2 wks, increased this am with abd pain. The pain is cramping under umbilicus, sharp stabbing with vomiting in LLQ. She has also had loose BM's this am, feels cold and is swimmy headed. She has ED visit 6.29 with similar c/o- BHCG was 985, U/S showed IUGS, no yolk sac for fetal pole, no adnexal masses. Repeat BHCG 6/29 was 2,750 at [facility]. She has an U/S scheduled for 7/11. She has hx IBS and Crohn dx.  OB History    Gravida Para Term Preterm AB TAB SAB Ectopic Multiple Living   0 1 0 1 0 0 3      Past Medical History  Diagnosis Date  . IBS (irritable bowel syndrome)   . Crohn disease     Past Surgical History  Procedure Laterality Date  . Tonsillectomy    . Tubal ligation      Family History  Problem Relation Age of Onset  . Depression Mother   . Depression Father   . Heart disease Father   . Alcohol abuse Father   . Hypertension Father   . Anesthesia problems Neg Hx   . Hypotension Neg Hx   . Malignant hyperthermia Neg Hx   . Pseudochol deficiency Neg Hx   . Alcohol abuse Paternal Uncle   . Diabetes Maternal Grandfather   . Alcohol abuse Paternal Grandmother   . Alcohol abuse Paternal Grandfather   . Cancer Paternal Grandfather     lung    History  Substance Use Topics  . Smoking status: Current Every Day Smoker -- 0.50 packs/day    Types: Cigarettes  . Smokeless tobacco: Never Used  . Alcohol Use: No    Allergies:  Allergies  Allergen Reactions  . Naproxen Nausea And Vomiting  .  Sulfa Antibiotics Hives    Prescriptions prior to admission  Medication Sig Dispense Refill Last Dose  . Prenatal Multivit-Min-Fe-FA (PRENATAL VITAMINS) 0.8 MG tablet Take 1 tablet by mouth daily. 30 tablet 0 06/23/2015 at Unknown time  . acetaminophen (TYLENOL) 500 MG tablet Take 1,000 mg by mouth every 6 (six) hours as needed for mild pain, moderate pain or fever.   prn  . Doxylamine-Pyridoxine (DICLEGIS) 10-10 MG TBEC Take 10 mg by mouth See admin instructions. (Patient not taking: Reported on 06/24/2015) 24 tablet 0   . Doxylamine-Pyridoxine 10-10 MG TBEC Take 2 tablets by mouth at bedtime. (Patient not taking: Reported on 06/24/2015) 60 tablet 0   . HYDROcodone-acetaminophen (NORCO/VICODIN) 5-325 MG per tablet Take 1-2 tablets by mouth every 6 (six) hours as needed for moderate pain (cough). (Patient not taking: Reported on 06/20/2015) 12 tablet 0 Not Taking  . ondansetron (ZOFRAN) 4 MG tablet Take 1 tablet (4 mg total) by mouth every 6 (six) hours as needed for nausea. (Patient not taking: Reported on 06/24/2015) 20 tablet 0 Taking  . oxyCODONE-acetaminophen (PERCOCET/ROXICET) 5-325 MG per tablet Take 1 tablet by mouth every 4 (four) hours as needed. (Patient not taking: Reported  on 06/20/2015) 15 tablet 0 Not Taking    Review of Systems  Constitutional: Positive for chills and diaphoresis.  Gastrointestinal: Positive for nausea, vomiting, abdominal pain and diarrhea.  Genitourinary: Negative for dysuria, urgency and frequency.  Neurological: Positive for dizziness.   Physical Exam   Blood pressure 133/86, pulse 89, temperature 97.6 F (36.4 C), temperature source Oral, resp. rate 22, last menstrual period 05/10/2015, SpO2 100 %.  Physical Exam  Constitutional: She is oriented to person, place, and time. She appears well-developed and well-nourished.  GI: Soft. There is tenderness.  Musculoskeletal: Normal range of motion.  Neurological: She is alert and oriented to person, place, and time.   Skin: Skin is warm. She is diaphoretic.  Psychiatric: She has a normal mood and affect. Her behavior is normal.    MAU Course  Procedures  MDM Results for orders placed or performed during the hospital encounter of 06/24/15 (from the past 24 hour(s))  CBC     Status: Abnormal   Collection Time: 06/24/15  2:50 PM  Result Value Ref Range   WBC 18.0 (H) 4.0 - 10.5 K/uL   RBC 4.30 3.87 - 5.11 MIL/uL   Hemoglobin 13.3 12.0 - 15.0 g/dL   HCT 16.1 09.6 - 04.5 %   MCV 90.0 78.0 - 100.0 fL   MCH 30.9 26.0 - 34.0 pg   MCHC 34.4 30.0 - 36.0 g/dL   RDW 40.9 81.1 - 91.4 %   Platelets 248 150 - 400 K/uL  hCG, quantitative, pregnancy     Status: Abnormal   Collection Time: 06/24/15  2:50 PM  Result Value Ref Range   hCG, Beta Chain, Quant, S 12509 (H) <5 mIU/mL  Urinalysis, Routine w reflex microscopic (not at [facility])     Status: None   Collection Time: 06/24/15  3:39 PM  Result Value Ref Range   Color, Urine YELLOW YELLOW   APPearance CLEAR CLEAR   Specific Gravity, Urine 1.015 1.005 - 1.030   pH 8.0 5.0 - 8.0   Glucose, UA NEGATIVE NEGATIVE mg/dL   Hgb urine dipstick NEGATIVE NEGATIVE   Bilirubin Urine NEGATIVE NEGATIVE   Ketones, ur NEGATIVE NEGATIVE mg/dL   Protein, ur NEGATIVE NEGATIVE mg/dL   Urobilinogen, UA 0.2 0.0 - 1.0 mg/dL   Nitrite NEGATIVE NEGATIVE   Leukocytes, UA NEGATIVE NEGATIVE    US Ob Transvaginal  06/24/2015   CLINICAL DATA:  34 year old with LMP 05/10/2015 (6 weeks 3 days), presenting with steadily worsening pelvic pain over the past 8 days. Prior ultrasound 06/18/2015 demonstrated a very small intrauterine gestational sac. Quantitative beta-hCGs are rising appropriately, measuring 12,509 today (985 on 06/18/2015).  EXAM: TRANSVAGINAL OB ULTRASOUND < 14 WEEKS  TECHNIQUE: Transvaginal ultrasound was performed for complete evaluation of the gestation as well as the maternal uterus, adnexal regions, and pelvic cul-de-sac.  COMPARISON:  06/18/2015. Estimated gestational  age based on this prior ultrasound is 6 weeks 0 days.  FINDINGS: Intrauterine gestational sac: Present, oval-shaped.  Yolk sac:  Not present.  Embryo:  Not present.  Cardiac Activity: Not applicable.  MSD: 11 mm   5 w 6 d  [place] EDC: 02/18/2016  Maternal uterus/adnexae: No evidence of subchorionic hemorrhage. Normal-appearing cervix. Normal-appearing left ovary containing a small follicular cyst measuring approximately 4.4 x 3.3 x 2.9 cm. Normal-appearing right ovary measuring approximately 3.0 x 2.0 x 1.9 cm. No adnexal masses or free pelvic fluid.  IMPRESSION: 1. Single intrauterine gestational sac without a visible yolk sac or embryo at this time due  to the early gestational age. Estimated gestational age by mean sac diameter is 5 weeks 6 days. This indicates appropriate interval growth since the ultrasound 6 days ago. 2. No evidence of subchorionic hemorrhage. 3. Normal-appearing ovaries. No adnexal masses or free pelvic fluid.   Electronically Signed   By: Hulan Saas M.D.   On: 06/24/2015 17:20       BHCG has increased nicely from 2,750 on 6/29 to 12,509 today. CBC- WBC has increased to 18, no fevers. U/S  showed oval shaped 5 6/7 wks IUGS without yolk sac that is increased in size from 6/27. Her vomiting is controlled with Phenergan, she still has abd pain and swimmy headed/dizzy. She has hx IBS and Crohns Dx, her pain probably from this. We cannot completely rule out ectopic pregnancy or appendix,.  Assessment and Plan  5 6/7 wks IUGS N&V due to pregnancy and IBS/Crohn dx flare. Medical options of tx reviewed with her and her partner. They do not want any medication that may be harmful to development of fetus at this early gestation. Will continue with Phenergan sparingly, add Vit B6/Unisom or Bonine. She is to call [facility] for  F/U.  Precautions reviewed for ectopic and appendix. Consulted with Dr Estanislado Pandy, [place]. 06/24/2015, 2:26 PM

## 2015-06-26 ENCOUNTER — Telehealth: Payer: Self-pay | Admitting: Obstetrics & Gynecology

## 2015-06-26 NOTE — Telephone Encounter (Signed)
Pt c/o diarrhea, dizziness, nausea Pt states she was seen on Saturday at [facility] for nausea and vomiting, "unable to keep anything down." Pt informed to go to MAU for evaluation due to diarrhea since Saturday. Pt verbalized understanding.

## 2015-07-02 ENCOUNTER — Ambulatory Visit (INDEPENDENT_AMBULATORY_CARE_PROVIDER_SITE_OTHER): Payer: Medicaid Other

## 2015-07-02 DIAGNOSIS — Z3A01 Less than 8 weeks gestation of pregnancy: Secondary | ICD-10-CM

## 2015-07-02 DIAGNOSIS — O208 Other hemorrhage in early pregnancy: Secondary | ICD-10-CM

## 2015-07-02 DIAGNOSIS — O3680X1 Pregnancy with inconclusive fetal viability, fetus 1: Secondary | ICD-10-CM

## 2015-07-02 NOTE — Progress Notes (Addendum)
[place] 6+2wks single IUP w/ys,pos FHT 126bpm,normal ov's bilat,crl 6.26mm,3.6 x 2.2 x .43cm subchorionic hemorrhage

## 2015-07-03 ENCOUNTER — Ambulatory Visit (INDEPENDENT_AMBULATORY_CARE_PROVIDER_SITE_OTHER): Payer: Medicaid Other | Admitting: Women's Health

## 2015-07-03 ENCOUNTER — Encounter: Payer: Self-pay | Admitting: Women's Health

## 2015-07-03 VITALS — BP 102/70 | HR 86 | Wt 148.0 lb

## 2015-07-03 DIAGNOSIS — Z369 Encounter for antenatal screening, unspecified: Secondary | ICD-10-CM

## 2015-07-03 DIAGNOSIS — Z720 Tobacco use: Secondary | ICD-10-CM | POA: Diagnosis not present

## 2015-07-03 DIAGNOSIS — Z6791 Unspecified blood type, Rh negative: Secondary | ICD-10-CM | POA: Insufficient documentation

## 2015-07-03 DIAGNOSIS — O360111 Maternal care for anti-D [Rh] antibodies, first trimester, fetus 1: Secondary | ICD-10-CM

## 2015-07-03 DIAGNOSIS — Z0283 Encounter for blood-alcohol and blood-drug test: Secondary | ICD-10-CM

## 2015-07-03 DIAGNOSIS — O26899 Other specified pregnancy related conditions, unspecified trimester: Secondary | ICD-10-CM

## 2015-07-03 DIAGNOSIS — Z3491 Encounter for supervision of normal pregnancy, unspecified, first trimester: Secondary | ICD-10-CM

## 2015-07-03 DIAGNOSIS — Z331 Pregnant state, incidental: Secondary | ICD-10-CM

## 2015-07-03 DIAGNOSIS — Z1389 Encounter for screening for other disorder: Secondary | ICD-10-CM

## 2015-07-03 DIAGNOSIS — F17200 Nicotine dependence, unspecified, uncomplicated: Secondary | ICD-10-CM

## 2015-07-03 DIAGNOSIS — Z3490 Encounter for supervision of normal pregnancy, unspecified, unspecified trimester: Secondary | ICD-10-CM | POA: Insufficient documentation

## 2015-07-03 LAB — POCT URINALYSIS DIPSTICK
Blood, UA: NEGATIVE
Glucose, UA: NEGATIVE
Ketones, UA: NEGATIVE
Leukocytes, UA: NEGATIVE
Nitrite, UA: NEGATIVE
Protein, UA: NEGATIVE

## 2015-07-03 NOTE — Progress Notes (Signed)
Subjective:  Mary Lowe is a 34 y.o. [phone] Caucasian female at [redacted]w[redacted]d by 6wk u/s, being seen today for her first obstetrical visit.  Her obstetrical history is significant for term SVB x 3, SAB x 2, and ectopic x 1. She reports the doctor saying the last baby's shoulder's were stuck at delivery, but delivery note doesn't indicate this and apgars 9/9. He was her largest infant at 8lb10.6oz, she gained 70+lbs that pregnancy. . Smoker: >1ppd prior to pregnancy, now at ~1/2ppd- wants to quit.  FOB's brother had club foot. Pregnancy history fully reviewed.  Patient reports nausea- has diclegis at home, hasn't started yet. Denies vb, cramping, uti s/s, abnormal/malodorous vag d/c, or vulvovaginal itching/irritation. Has already gained 21lbs since pregnant  BP 102/70 mmHg  Pulse 86  Wt 148 lb (67.132 kg)  LMP 05/10/2015  HISTORY: OB History  Gravida Para Term Preterm AB SAB TAB Ectopic Multiple Living  0 3 2 0 1 0 3    # Outcome Date GA Lbr Len/2nd Weight Sex Delivery Anes PTL Lv  7 Current           6 Ectopic 2015          5 Term 03/12/12 [redacted]w[redacted]d 09:08 / 00:05 8 lb 10.6 oz (3.929 kg) M Vag-Spont EPI  Y     Comments: Bruised face  4 SAB 2011          3 Term 2006   7 lb 4 oz (3.289 kg) F Vag-Spont   Y  2 Term 2004   8 lb 5 oz (3.771 kg) F Vag-Spont   Y  1 SAB              Past Medical History  Diagnosis Date  . IBS (irritable bowel syndrome)   . Crohn disease    Past Surgical History  Procedure Laterality Date  . Tonsillectomy    . Ectopic pregnancy surgery     Family History  Problem Relation Age of Onset  . Depression Mother   . Depression Father   . Heart disease Father   . Alcohol abuse Father   . Hypertension Father   . Anesthesia problems Neg Hx   . Hypotension Neg Hx   . Malignant hyperthermia Neg Hx   . Pseudochol deficiency Neg Hx   . Alcohol abuse Paternal Uncle   . Diabetes Maternal Grandfather   . Alcohol abuse Paternal Grandmother   . Alcohol abuse  Paternal Grandfather   . Cancer Paternal Grandfather     lung  . Cancer Maternal Grandmother     Exam   System:     General: Well developed & nourished, no acute distress   Skin: Warm & dry, normal coloration and turgor, no rashes   Neurologic: Alert & oriented, normal mood   Cardiovascular: Regular rate & rhythm   Respiratory: Effort & rate normal, LCTAB, acyanotic   Abdomen: Soft, non tender   Extremities: normal strength, tone  Thin prep pap smear neg 2015 at [facility]    Assessment:   Pregnancy: [id number] Patient Active Problem List   Diagnosis Date Noted  . Supervision of normal pregnancy 07/03/2015    Priority: High  . Smoker 07/03/2015  . Abdominal pain complicating pregnancy 07/14/2011    [redacted]w[redacted]d [id number] New OB visit Prev ectopic Smoker Poss shoulder dystocia last delivery A-  Plan:  Initial labs drawn Continue prenatal vitamins Problem list reviewed and updated Reviewed n/v relief measures and warning s/s to report  Reviewed recommended weight gain based on pre-gravid BMI- is already at 21lb wt gain of recommended 25-35lbs- cut back on carbs, exercise- walk/swim daily Encouraged well-balanced diet Genetic Screening discussed Integrated Screen: requested Cystic fibrosis screening discussed requested Ultrasound discussed; fetal survey: requested Follow up in 4 weeks for visit [facility] completed Smokes 1/2pp/day, advised cessation, discussed risks to fetus while pregnant, to infant pp, and to herself. Offered [facility], accepted, referral sent.     Marge Duncans CNM, [facility] 07/03/2015 10:50 AM

## 2015-07-03 NOTE — Patient Instructions (Signed)

## 2015-07-05 LAB — ABO/RH: RH TYPE: NEGATIVE

## 2015-07-05 LAB — URINALYSIS, ROUTINE W REFLEX MICROSCOPIC
BILIRUBIN UA: NEGATIVE
Glucose, UA: NEGATIVE
Ketones, UA: NEGATIVE
Leukocytes, UA: NEGATIVE
Nitrite, UA: NEGATIVE
PH UA: 7 (ref 5.0–7.5)
Protein, UA: NEGATIVE
RBC, UA: NEGATIVE
Specific Gravity, UA: 1.022 (ref 1.005–1.030)
Urobilinogen, Ur: 0.2 mg/dL (ref 0.2–1.0)

## 2015-07-05 LAB — CBC
HEMATOCRIT: 41.4 % (ref 34.0–46.6)
HEMOGLOBIN: 14 g/dL (ref 11.1–15.9)
MCH: 30.5 pg (ref 26.6–33.0)
MCHC: 33.8 g/dL (ref 31.5–35.7)
MCV: 90 fL (ref 79–97)
PLATELETS: 271 10*3/uL (ref 150–379)
RBC: 4.59 x10E6/uL (ref 3.77–5.28)
RDW: 13.9 % (ref 12.3–15.4)
WBC: 14.9 10*3/uL — ABNORMAL HIGH (ref 3.4–10.8)

## 2015-07-05 LAB — PMP SCREEN PROFILE (10S), URINE
AMPHETAMINE SCRN UR: NEGATIVE ng/mL
BENZODIAZEPINE SCREEN, URINE: NEGATIVE ng/mL
Barbiturate Screen, Ur: NEGATIVE ng/mL
Cannabinoids Ur Ql Scn: POSITIVE ng/mL
Cocaine(Metab.)Screen, Urine: NEGATIVE ng/mL
Creatinine(Crt), U: 172.5 mg/dL (ref 20.0–300.0)
Methadone Scn, Ur: NEGATIVE ng/mL
Opiate Scrn, Ur: NEGATIVE ng/mL
Oxycodone+Oxymorphone Ur Ql Scn: NEGATIVE ng/mL
PCP SCRN UR: NEGATIVE ng/mL
Ph of Urine: 7.1 (ref 4.5–8.9)
Propoxyphene, Screen: NEGATIVE ng/mL

## 2015-07-05 LAB — GC/CHLAMYDIA PROBE AMP
Chlamydia trachomatis, NAA: NEGATIVE
Neisseria gonorrhoeae by PCR: NEGATIVE

## 2015-07-05 LAB — HIV ANTIBODY (ROUTINE TESTING W REFLEX): HIV Screen 4th Generation wRfx: NONREACTIVE

## 2015-07-05 LAB — ANTIBODY SCREEN: Antibody Screen: NEGATIVE

## 2015-07-05 LAB — RPR: RPR Ser Ql: NONREACTIVE

## 2015-07-05 LAB — URINE CULTURE

## 2015-07-05 LAB — RUBELLA SCREEN: Rubella Antibodies, IGG: 2.32 index (ref >0.99)

## 2015-07-05 LAB — HEPATITIS B SURFACE ANTIGEN: HEP B S AG: NEGATIVE

## 2015-07-05 LAB — VARICELLA ZOSTER ANTIBODY, IGG

## 2015-07-09 ENCOUNTER — Telehealth: Payer: Self-pay | Admitting: Women's Health

## 2015-07-09 ENCOUNTER — Encounter: Payer: Self-pay | Admitting: Women's Health

## 2015-07-09 DIAGNOSIS — Z2839 Other underimmunization status: Secondary | ICD-10-CM | POA: Insufficient documentation

## 2015-07-09 DIAGNOSIS — R8271 Bacteriuria: Secondary | ICD-10-CM | POA: Insufficient documentation

## 2015-07-09 DIAGNOSIS — O09899 Supervision of other high risk pregnancies, unspecified trimester: Secondary | ICD-10-CM | POA: Insufficient documentation

## 2015-07-09 DIAGNOSIS — O9989 Other specified diseases and conditions complicating pregnancy, childbirth and the puerperium: Secondary | ICD-10-CM

## 2015-07-09 DIAGNOSIS — Z283 Underimmunization status: Secondary | ICD-10-CM

## 2015-07-09 DIAGNOSIS — F1290 Cannabis use, unspecified, uncomplicated: Secondary | ICD-10-CM | POA: Insufficient documentation

## 2015-07-09 MED ORDER — CEPHALEXIN 500 MG PO CAPS: 500.0000 mg | ORAL_CAPSULE | Freq: Four times a day (QID) | ORAL | Status: DC

## 2015-07-09 NOTE — Telephone Encounter (Signed)
LM for pt to return call, need to notify of UTI- rx sent to pharmacy.  Cheral Marker, CNM, [facility] 07/09/2015 11:38 AM

## 2015-07-09 NOTE — Telephone Encounter (Signed)
Left message x 1. JSY 

## 2015-07-10 LAB — CYSTIC FIBROSIS MUTATION 97: Interpretation: NOT DETECTED

## 2015-07-10 NOTE — Telephone Encounter (Signed)
Spoke with pt. Pt states spotting is better. Still cramping. Started Keflex today for UTI. Taking Tylenol for pain. I advised hopefully the cramping will get better when the UTI starts to improve. Advised to let us know if she has any problems. Pt voiced understanding. JSY

## 2015-07-11 ENCOUNTER — Telehealth: Payer: Self-pay | Admitting: Women's Health

## 2015-07-11 NOTE — Telephone Encounter (Signed)
Was calling to notify pt of uti and rx at pharmacy, but see note in chart where nurse spoke w/ her yesterday and she is already taking keflex that I rx'd.  Cheral Marker, CNM, [facility] 07/11/2015 10:13 AM

## 2015-07-31 ENCOUNTER — Encounter: Payer: Self-pay | Admitting: Women's Health

## 2015-07-31 ENCOUNTER — Ambulatory Visit (INDEPENDENT_AMBULATORY_CARE_PROVIDER_SITE_OTHER): Payer: Medicaid Other | Admitting: Women's Health

## 2015-07-31 VITALS — BP 100/62 | HR 80 | Wt 146.0 lb

## 2015-07-31 DIAGNOSIS — R1011 Right upper quadrant pain: Secondary | ICD-10-CM

## 2015-07-31 DIAGNOSIS — Z3491 Encounter for supervision of normal pregnancy, unspecified, first trimester: Secondary | ICD-10-CM

## 2015-07-31 DIAGNOSIS — F1290 Cannabis use, unspecified, uncomplicated: Secondary | ICD-10-CM

## 2015-07-31 DIAGNOSIS — R197 Diarrhea, unspecified: Secondary | ICD-10-CM

## 2015-07-31 DIAGNOSIS — Z1389 Encounter for screening for other disorder: Secondary | ICD-10-CM

## 2015-07-31 DIAGNOSIS — Z331 Pregnant state, incidental: Secondary | ICD-10-CM

## 2015-07-31 DIAGNOSIS — Z3682 Encounter for antenatal screening for nuchal translucency: Secondary | ICD-10-CM

## 2015-07-31 LAB — POCT URINALYSIS DIPSTICK
Blood, UA: NEGATIVE
Glucose, UA: NEGATIVE
KETONES UA: NEGATIVE
Leukocytes, UA: NEGATIVE
NITRITE UA: NEGATIVE
PROTEIN UA: NEGATIVE

## 2015-07-31 MED ORDER — PROMETHAZINE HCL 25 MG RE SUPP: 25.0000 mg | Freq: Four times a day (QID) | RECTAL | Status: DC | PRN

## 2015-07-31 NOTE — Progress Notes (Signed)
Low-risk OB appointment [id number] [redacted]w[redacted]d Estimated Date of Delivery: 02/23/16 BP 100/62 mmHg  Pulse 80  Wt 146 lb (66.225 kg)  LMP 05/10/2015  BP, weight, and urine reviewed.  Refer to obstetrical flow sheet for FH & FHR.  No fm yet. Denies cramping, lof, vb, or uti s/s. N/V/D x 4wks, can't keep anything down, has 9-10 diarrheal stools/day- goes as soon as she tries to eat. RUQ pain 'like something is ripping me apart'. Went to [facility]- they thought was her Crohn's/IBS- gave her meds, 2L fluid- didn't help. This doesn't feel like Crohn's flare to her. Hasn't seen GI in about 1 yr. Not on any Crohn's meds. Phenergan suppositories help w/ n/v, but she is out. Refill sent. Did take course of keflex 7/18 for ASB. Discussed w/ [facility], likely Crohn's flare, will check CMP, Amylase, Lipase, stool for O&P and c-diff toxin, and she should schedule appt w/ GI asap.  Discussed urine +THC, states she doesn't smoke it- was around her cousins in their home while they were, hasn't been there in about 2wks. Will retest at next visit.  Request pap from [facility] Reviewed warning s/s to report. Plan:  Continue routine obstetrical care  F/U in 2wks for OB appointment and 1st it/nt

## 2015-07-31 NOTE — Patient Instructions (Addendum)
Schedule appointment with GI doctor  First Trimester of Pregnancy The first trimester of pregnancy is from week 1 until the end of week 12 (months 1 through 3). A week after a sperm fertilizes an egg, the egg will implant on the wall of the uterus. This embryo will begin to develop into a baby. Genes from you and your partner are forming the baby. The female genes determine whether the baby is a boy or a girl. At 6-8 weeks, the eyes and face are formed, and the heartbeat can be seen on ultrasound. At the end of 12 weeks, all the baby's organs are formed.  Now that you are pregnant, you will want to do everything you can to have a healthy baby. Two of the most important things are to get good prenatal care and to follow your health care provider's instructions. Prenatal care is all the medical care you receive before the baby's birth. This care will help prevent, find, and treat any problems during the pregnancy and childbirth. BODY CHANGES Your body goes through many changes during pregnancy. The changes vary from woman to woman.   You may gain or lose a couple of pounds at first.  You may feel sick to your stomach (nauseous) and throw up (vomit). If the vomiting is uncontrollable, call your health care provider.  You may tire easily.  You may develop headaches that can be relieved by medicines approved by your health care provider.  You may urinate more often. Painful urination may mean you have a bladder infection.  You may develop heartburn as a result of your pregnancy.  You may develop constipation because certain hormones are causing the muscles that push waste through your intestines to slow down.  You may develop hemorrhoids or swollen, bulging veins (varicose veins).  Your breasts may begin to grow larger and become tender. Your nipples may stick out more, and the tissue that surrounds them (areola) may become darker.  Your gums may bleed and may be sensitive to brushing and  flossing.  Dark spots or blotches (chloasma, mask of pregnancy) may develop on your face. This will likely fade after the baby is born.  Your menstrual periods will stop.  You may have a loss of appetite.  You may develop cravings for certain kinds of food.  You may have changes in your emotions from day to day, such as being excited to be pregnant or being concerned that something may go wrong with the pregnancy and baby.  You may have more vivid and strange dreams.  You may have changes in your hair. These can include thickening of your hair, rapid growth, and changes in texture. Some women also have hair loss during or after pregnancy, or hair that feels dry or thin. Your hair will most likely return to normal after your baby is born. WHAT TO EXPECT AT YOUR PRENATAL VISITS During a routine prenatal visit:  You will be weighed to make sure you and the baby are growing normally.  Your blood pressure will be taken.  Your abdomen will be measured to track your baby's growth.  The fetal heartbeat will be listened to starting around week 10 or 12 of your pregnancy.  Test results from any previous visits will be discussed. Your health care provider may ask you:  How you are feeling.  If you are feeling the baby move.  If you have had any abnormal symptoms, such as leaking fluid, bleeding, severe headaches, or abdominal cramping.  If  you have any questions. Other tests that may be performed during your first trimester include:  Blood tests to find your blood type and to check for the presence of any previous infections. They will also be used to check for low iron levels (anemia) and Rh antibodies. Later in the pregnancy, blood tests for diabetes will be done along with other tests if problems develop.  Urine tests to check for infections, diabetes, or protein in the urine.  An ultrasound to confirm the proper growth and development of the baby.  An amniocentesis to check for  possible genetic problems.  Fetal screens for spina bifida and Down syndrome.  You may need other tests to make sure you and the baby are doing well. HOME CARE INSTRUCTIONS  Medicines  Follow your health care provider's instructions regarding medicine use. Specific medicines may be either safe or unsafe to take during pregnancy.  Take your prenatal vitamins as directed.  If you develop constipation, try taking a stool softener if your health care provider approves. Diet  Eat regular, well-balanced meals. Choose a variety of foods, such as meat or vegetable-based protein, fish, milk and low-fat dairy products, vegetables, fruits, and whole grain breads and cereals. Your health care provider will help you determine the amount of weight gain that is right for you.  Avoid raw meat and uncooked cheese. These carry germs that can cause birth defects in the baby.  Eating four or five small meals rather than three large meals a day may help relieve nausea and vomiting. If you start to feel nauseous, eating a few soda crackers can be helpful. Drinking liquids between meals instead of during meals also seems to help nausea and vomiting.  If you develop constipation, eat more high-fiber foods, such as fresh vegetables or fruit and whole grains. Drink enough fluids to keep your urine clear or pale yellow. Activity and Exercise  Exercise only as directed by your health care provider. Exercising will help you:  Control your weight.  Stay in shape.  Be prepared for labor and delivery.  Experiencing pain or cramping in the lower abdomen or low back is a good sign that you should stop exercising. Check with your health care provider before continuing normal exercises.  Try to avoid standing for long periods of time. Move your legs often if you must stand in one place for a long time.  Avoid heavy lifting.  Wear low-heeled shoes, and practice good posture.  You may continue to have sex unless  your health care provider directs you otherwise. Relief of Pain or Discomfort  Wear a good support bra for breast tenderness.   Take warm sitz baths to soothe any pain or discomfort caused by hemorrhoids. Use hemorrhoid cream if your health care provider approves.   Rest with your legs elevated if you have leg cramps or low back pain.  If you develop varicose veins in your legs, wear support hose. Elevate your feet for 15 minutes, 3-4 times a day. Limit salt in your diet. Prenatal Care  Schedule your prenatal visits by the twelfth week of pregnancy. They are usually scheduled monthly at first, then more often in the last 2 months before delivery.  Write down your questions. Take them to your prenatal visits.  Keep all your prenatal visits as directed by your health care provider. Safety  Wear your seat belt at all times when driving.  Make a list of emergency phone numbers, including numbers for family, friends, the hospital, and  police and Garment/textile technologist. General Tips  Ask your health care provider for a referral to a local prenatal education class. Begin classes no later than at the beginning of month 6 of your pregnancy.  Ask for help if you have counseling or nutritional needs during pregnancy. Your health care provider can offer advice or refer you to specialists for help with various needs.  Do not use hot tubs, steam rooms, or saunas.  Do not douche or use tampons or scented sanitary pads.  Do not cross your legs for long periods of time.  Avoid cat litter boxes and soil used by cats. These carry germs that can cause birth defects in the baby and possibly loss of the fetus by miscarriage or stillbirth.  Avoid all smoking, herbs, alcohol, and medicines not prescribed by your health care provider. Chemicals in these affect the formation and growth of the baby.  Schedule a dentist appointment. At home, brush your teeth with a soft toothbrush and be gentle when you  floss. SEEK MEDICAL CARE IF:   You have dizziness.  You have mild pelvic cramps, pelvic pressure, or nagging pain in the abdominal area.  You have persistent nausea, vomiting, or diarrhea.  You have a bad smelling vaginal discharge.  You have pain with urination.  You notice increased swelling in your face, hands, legs, or ankles. SEEK IMMEDIATE MEDICAL CARE IF:   You have a fever.  You are leaking fluid from your vagina.  You have spotting or bleeding from your vagina.  You have severe abdominal cramping or pain.  You have rapid weight gain or loss.  You vomit blood or material that looks like coffee grounds.  You are exposed to [place] measles and have never had them.  You are exposed to fifth disease or chickenpox.  You develop a severe headache.  You have shortness of breath.  You have any kind of trauma, such as from a fall or a car accident. Document Released: 12/02/2001 Document Revised: 04/24/2014 Document Reviewed: 10/18/2013 [facility] Patient Information 2015 [place], [place]. This information is not intended to replace advice given to you by your health care provider. Make sure you discuss any questions you have with your health care provider.  Low-Fat Diet for Pancreatitis or Gallbladder Conditions A low-fat diet can be helpful if you have pancreatitis or a gallbladder condition. With these conditions, your pancreas and gallbladder have trouble digesting fats. A healthy eating plan with less fat will help rest your pancreas and gallbladder and reduce your symptoms. WHAT DO I NEED TO KNOW ABOUT THIS DIET?  Eat a low-fat diet.  Reduce your fat intake to less than 20-30% of your total daily calories. This is less than 50-60 g of fat per day.  Remember that you need some fat in your diet. Ask your dietician what your daily goal should be.  Choose nonfat and low-fat healthy foods. Look for the words "nonfat," "low fat," or "fat free."  As a guide, look on the  label and choose foods with less than 3 g of fat per serving. Eat only one serving.  Avoid alcohol.  Do not smoke. If you need help quitting, talk with your health care provider.  Eat small frequent meals instead of three large heavy meals. WHAT FOODS CAN I EAT? Grains Include healthy grains and starches such as potatoes, wheat bread, fiber-rich cereal, and brown rice. Choose whole grain options whenever possible. In adults, whole grains should account for 45-65% of your daily calories.  Fruits and Vegetables Eat plenty of fruits and vegetables. Fresh fruits and vegetables add fiber to your diet. Meats and Other Protein Sources Eat lean meat such as chicken and pork. Trim any fat off of meat before cooking it. Eggs, fish, and beans are other sources of protein. In adults, these foods should account for 10-35% of your daily calories. Dairy Choose low-fat milk and dairy options. Dairy includes fat and protein, as well as calcium.  Fats and Oils Limit high-fat foods such as fried foods, sweets, baked goods, sugary drinks.  Other Creamy sauces and condiments, such as mayonnaise, can add extra fat. Think about whether or not you need to use them, or use smaller amounts or low fat options. WHAT FOODS ARE NOT RECOMMENDED?  High fat foods, such as:  [employer].  Ice cream.  [place] toast.  Sweet rolls.  Pizza.  Cheese bread.  Foods covered with batter, butter, creamy sauces, or cheese.  Fried foods.  Sugary drinks and desserts.  Foods that cause gas or bloating Document Released: 12/13/2013 Document Reviewed: 12/13/2013 [facility] Patient Information 2015 [place], [place]. This information is not intended to replace advice given to you by your health care provider. Make sure you discuss any questions you have with your health care provider.

## 2015-08-01 LAB — COMPREHENSIVE METABOLIC PANEL
ALK PHOS: 54 IU/L (ref 39–117)
ALT: 13 IU/L (ref 0–32)
AST: 15 IU/L (ref 0–40)
Albumin/Globulin Ratio: 1.7 (ref 1.1–2.5)
Albumin: 4.3 g/dL (ref 3.5–5.5)
BUN / CREAT RATIO: 13 (ref 8–20)
BUN: 7 mg/dL (ref 6–20)
Bilirubin Total: 0.3 mg/dL (ref 0.0–1.2)
CALCIUM: 9.4 mg/dL (ref 8.7–10.2)
CHLORIDE: 100 mmol/L (ref 97–108)
CO2: 22 mmol/L (ref 18–29)
CREATININE: 0.55 mg/dL — AB (ref 0.57–1.00)
GFR calc Af Amer: 142 mL/min/{1.73_m2} (ref >59)
GFR calc non Af Amer: 123 mL/min/{1.73_m2} (ref >59)
Globulin, Total: 2.6 g/dL (ref 1.5–4.5)
Glucose: 227 mg/dL — ABNORMAL HIGH (ref 65–99)
POTASSIUM: 4.2 mmol/L (ref 3.5–5.2)
Sodium: 137 mmol/L (ref 134–144)
Total Protein: 6.9 g/dL (ref 6.0–8.5)

## 2015-08-01 LAB — LIPASE: Lipase: 58 U/L (ref 0–59)

## 2015-08-01 LAB — AMYLASE: AMYLASE: 72 U/L (ref 31–124)

## 2015-08-02 LAB — SPECIMEN STATUS REPORT

## 2015-08-03 LAB — CLOSTRIDIUM DIFFICILE EIA: C difficile Toxins A+B, EIA: NEGATIVE

## 2015-08-06 LAB — OVA AND PARASITE EXAMINATION

## 2015-08-07 ENCOUNTER — Telehealth: Payer: Self-pay | Admitting: Women's Health

## 2015-08-07 DIAGNOSIS — R7309 Other abnormal glucose: Secondary | ICD-10-CM

## 2015-08-07 NOTE — Telephone Encounter (Signed)
Pt returned call, states she is feeling some better since last visit. Unable to get appt w/ her GI until next month. Gave her GI doctors in [place]. Notified of labs, glucose 227- unable to add on A1C d/t [facility]- so pt states she can come in tomorrow to have it drawn.  Cheral Marker, CNM, [facility] 08/07/2015 3:53 PM

## 2015-08-07 NOTE — Telephone Encounter (Signed)
LM for pt to return call. Need to notify of labs, see if she can come have Hgb A1C drawn.  Cheral Marker, CNM, [facility] 08/07/2015 2:58 PM

## 2015-08-09 ENCOUNTER — Telehealth: Payer: Self-pay | Admitting: Women's Health

## 2015-08-09 LAB — HEMOGLOBIN A1C
ESTIMATED AVERAGE GLUCOSE: 111 mg/dL
HEMOGLOBIN A1C: 5.5 % (ref 4.8–5.6)

## 2015-08-09 NOTE — Telephone Encounter (Signed)
Left message x 1. JSY 

## 2015-08-10 ENCOUNTER — Telehealth: Payer: Self-pay | Admitting: Women's Health

## 2015-08-10 NOTE — Telephone Encounter (Signed)
Left message x 3. Another encounter opened with the same message. Encounter closed. JSY

## 2015-08-13 NOTE — Telephone Encounter (Signed)
Spoke with pt letting her know HgA1C was normal. Pt voiced understanding. JSY

## 2015-08-13 NOTE — Telephone Encounter (Signed)
Left message x 3. JSY 

## 2015-08-14 ENCOUNTER — Ambulatory Visit (INDEPENDENT_AMBULATORY_CARE_PROVIDER_SITE_OTHER): Payer: Medicaid Other | Admitting: Women's Health

## 2015-08-14 ENCOUNTER — Encounter: Payer: Self-pay | Admitting: Women's Health

## 2015-08-14 ENCOUNTER — Ambulatory Visit (INDEPENDENT_AMBULATORY_CARE_PROVIDER_SITE_OTHER): Payer: Medicaid Other

## 2015-08-14 VITALS — BP 110/70 | HR 80 | Wt 146.0 lb

## 2015-08-14 DIAGNOSIS — Z3682 Encounter for antenatal screening for nuchal translucency: Secondary | ICD-10-CM

## 2015-08-14 DIAGNOSIS — Z331 Pregnant state, incidental: Secondary | ICD-10-CM

## 2015-08-14 DIAGNOSIS — Z1389 Encounter for screening for other disorder: Secondary | ICD-10-CM

## 2015-08-14 DIAGNOSIS — Z369 Encounter for antenatal screening, unspecified: Secondary | ICD-10-CM

## 2015-08-14 DIAGNOSIS — Z3491 Encounter for supervision of normal pregnancy, unspecified, first trimester: Secondary | ICD-10-CM

## 2015-08-14 DIAGNOSIS — Z36 Encounter for antenatal screening of mother: Secondary | ICD-10-CM | POA: Diagnosis not present

## 2015-08-14 DIAGNOSIS — K5090 Crohn's disease, unspecified, without complications: Secondary | ICD-10-CM

## 2015-08-14 LAB — POCT URINALYSIS DIPSTICK
Glucose, UA: NEGATIVE
KETONES UA: NEGATIVE
Leukocytes, UA: NEGATIVE
Nitrite, UA: NEGATIVE
PROTEIN UA: NEGATIVE
RBC UA: NEGATIVE

## 2015-08-14 MED ORDER — PROMETHAZINE HCL 25 MG PO TABS: 12.5000 mg | ORAL_TABLET | Freq: Four times a day (QID) | ORAL | Status: DC | PRN

## 2015-08-14 NOTE — Patient Instructions (Signed)
Second Trimester of Pregnancy The second trimester is from week 13 through week 28, months 4 through 6. The second trimester is often a time when you feel your best. Your body has also adjusted to being pregnant, and you begin to feel better physically. Usually, morning sickness has lessened or quit completely, you may have more energy, and you may have an increase in appetite. The second trimester is also a time when the fetus is growing rapidly. At the end of the sixth month, the fetus is about 9 inches long and weighs about 1 pounds. You will likely begin to feel the baby move (quickening) between 18 and 20 weeks of the pregnancy. BODY CHANGES Your body goes through many changes during pregnancy. The changes vary from woman to woman.   Your weight will continue to increase. You will notice your lower abdomen bulging out.  You may begin to get stretch marks on your hips, abdomen, and breasts.  You may develop headaches that can be relieved by medicines approved by your health care provider.  You may urinate more often because the fetus is pressing on your bladder.  You may develop or continue to have heartburn as a result of your pregnancy.  You may develop constipation because certain hormones are causing the muscles that push waste through your intestines to slow down.  You may develop hemorrhoids or swollen, bulging veins (varicose veins).  You may have back pain because of the weight gain and pregnancy hormones relaxing your joints between the bones in your pelvis and as a result of a shift in weight and the muscles that support your balance.  Your breasts will continue to grow and be tender.  Your gums may bleed and may be sensitive to brushing and flossing.  Dark spots or blotches (chloasma, mask of pregnancy) may develop on your face. This will likely fade after the baby is born.  A dark line from your belly button to the pubic area (linea nigra) may appear. This will likely fade  after the baby is born.  You may have changes in your hair. These can include thickening of your hair, rapid growth, and changes in texture. Some women also have hair loss during or after pregnancy, or hair that feels dry or thin. Your hair will most likely return to normal after your baby is born. WHAT TO EXPECT AT YOUR PRENATAL VISITS During a routine prenatal visit:  You will be weighed to make sure you and the fetus are growing normally.  Your blood pressure will be taken.  Your abdomen will be measured to track your baby's growth.  The fetal heartbeat will be listened to.  Any test results from the previous visit will be discussed. Your health care provider may ask you:  How you are feeling.  If you are feeling the baby move.  If you have had any abnormal symptoms, such as leaking fluid, bleeding, severe headaches, or abdominal cramping.  If you have any questions. Other tests that may be performed during your second trimester include:  Blood tests that check for:  Low iron levels (anemia).  Gestational diabetes (between 24 and 28 weeks).  Rh antibodies.  Urine tests to check for infections, diabetes, or protein in the urine.  An ultrasound to confirm the proper growth and development of the baby.  An amniocentesis to check for possible genetic problems.  Fetal screens for spina bifida and Down syndrome. HOME CARE INSTRUCTIONS   Avoid all smoking, herbs, alcohol, and unprescribed   drugs. These chemicals affect the formation and growth of the baby.  Follow your health care provider's instructions regarding medicine use. There are medicines that are either safe or unsafe to take during pregnancy.  Exercise only as directed by your health care provider. Experiencing uterine cramps is a good sign to stop exercising.  Continue to eat regular, healthy meals.  Wear a good support bra for breast tenderness.  Do not use hot tubs, steam rooms, or saunas.  Wear your  seat belt at all times when driving.  Avoid raw meat, uncooked cheese, cat litter boxes, and soil used by cats. These carry germs that can cause birth defects in the baby.  Take your prenatal vitamins.  Try taking a stool softener (if your health care provider approves) if you develop constipation. Eat more high-fiber foods, such as fresh vegetables or fruit and whole grains. Drink plenty of fluids to keep your urine clear or pale yellow.  Take warm sitz baths to soothe any pain or discomfort caused by hemorrhoids. Use hemorrhoid cream if your health care provider approves.  If you develop varicose veins, wear support hose. Elevate your feet for 15 minutes, 3-4 times a day. Limit salt in your diet.  Avoid heavy lifting, wear low heel shoes, and practice good posture.  Rest with your legs elevated if you have leg cramps or low back pain.  Visit your dentist if you have not gone yet during your pregnancy. Use a soft toothbrush to brush your teeth and be gentle when you floss.  A sexual relationship may be continued unless your health care provider directs you otherwise.  Continue to go to all your prenatal visits as directed by your health care provider. SEEK MEDICAL CARE IF:   You have dizziness.  You have mild pelvic cramps, pelvic pressure, or nagging pain in the abdominal area.  You have persistent nausea, vomiting, or diarrhea.  You have a bad smelling vaginal discharge.  You have pain with urination. SEEK IMMEDIATE MEDICAL CARE IF:   You have a fever.  You are leaking fluid from your vagina.  You have spotting or bleeding from your vagina.  You have severe abdominal cramping or pain.  You have rapid weight gain or loss.  You have shortness of breath with chest pain.  You notice sudden or extreme swelling of your face, hands, ankles, feet, or legs.  You have not felt your baby move in over an hour.  You have severe headaches that do not go away with  medicine.  You have vision changes. Document Released: 12/02/2001 Document Revised: 12/13/2013 Document Reviewed: 02/08/2013 [employer] Patient Information 2015 [employer]. This information is not intended to replace advice given to you by your health care provider. Make sure you discuss any questions you have with your health care provider.  

## 2015-08-14 NOTE — Progress Notes (Signed)
Pt denies any problems or concerns at this time.  

## 2015-08-14 NOTE — Progress Notes (Signed)
Low-risk OB appointment [id number] [redacted]w[redacted]d Estimated Date of Delivery: 02/23/16 BP 110/70 mmHg  Pulse 80  Wt 146 lb (66.225 kg)  LMP 05/10/2015  BP, weight, and urine reviewed.  Refer to obstetrical flow sheet for FH & FHR.  No fm yet. Denies cramping, lof, vb, or uti s/s. No complaints. Feeling much better from last visit, called [facility] [place] to get appt, needs referral- order/referral placed today for h/o Crohn's/IBS w/ recent flare and wants more local provider. Requests rx for po phenergan- rx given.  States she is switching prenatal care to [facility] d/t drive here being [id number] and Jonita Albee is only about for her, may want to wait until after next visit so can do 2nd IT/complete genetic screening.  Came in for A1C d/t random glucose on CMP being 227 (8/9), A1C was normal at 5.5 (8/17) Reviewed today's normal nt u/s, warning s/s to report. Plan:  Continue routine obstetrical care  F/U in 4wks for OB appointment and 2nd IT 1st IT/NT today

## 2015-08-14 NOTE — Progress Notes (Signed)
[place] 12+3wks,measurements c/w dates,normal ov 's bilat,CRL  68.48mm,NT 1.39mm,NB present,pos fht 158bpm

## 2015-08-16 ENCOUNTER — Telehealth: Payer: Self-pay | Admitting: Gastroenterology

## 2015-08-16 LAB — MATERNAL SCREEN, INTEGRATED #1
CROWN RUMP LENGTH MAT SCREEN: 68.8 mm
GEST. AGE ON COLLECTION DATE: 12.9 wk
MATERNAL AGE AT EDD: 35.1 a
NUCHAL TRANSLUCENCY (NT): 1.4 mm
NUMBER OF FETUSES: 1
PAPP-A VALUE: 870.2 ng/mL
WEIGHT: 146 [lb_av]

## 2015-08-16 NOTE — Telephone Encounter (Signed)
Discussed with office manager, Ave Filter. According to medical records, patient already has established care with GI in [place]. She could not get in to be seen within 4 weeks with current GI therefore she was referred to [place]. Patient should continue her GI care with her current GI provider.  Referring provider to be made aware. Kennyth Arnold, please let [facility] know above.

## 2015-08-16 NOTE — Telephone Encounter (Signed)
Cancelled referral and provided that information in notes for the provider to see.

## 2015-08-28 ENCOUNTER — Other Ambulatory Visit: Payer: Self-pay | Admitting: Women's Health

## 2015-08-29 ENCOUNTER — Ambulatory Visit (INDEPENDENT_AMBULATORY_CARE_PROVIDER_SITE_OTHER): Payer: Medicaid Other | Admitting: Obstetrics and Gynecology

## 2015-08-29 ENCOUNTER — Encounter: Payer: Self-pay | Admitting: Obstetrics and Gynecology

## 2015-08-29 VITALS — BP 100/60 | HR 92 | Wt 152.0 lb

## 2015-08-29 DIAGNOSIS — Z331 Pregnant state, incidental: Secondary | ICD-10-CM

## 2015-08-29 DIAGNOSIS — Z1389 Encounter for screening for other disorder: Secondary | ICD-10-CM

## 2015-08-29 DIAGNOSIS — N898 Other specified noninflammatory disorders of vagina: Secondary | ICD-10-CM

## 2015-08-29 LAB — POCT URINALYSIS DIPSTICK
GLUCOSE UA: NEGATIVE
Ketones, UA: NEGATIVE
Leukocytes, UA: NEGATIVE
NITRITE UA: NEGATIVE
Protein, UA: NEGATIVE
RBC UA: NEGATIVE

## 2015-08-29 NOTE — Progress Notes (Addendum)
This chart was scribed for Tilda Burrow, MD by Jarvis Morgan, ED Scribe. This patient was seen in room 2 and the patient's care was started at 9:38 AM.  [id number] [redacted]w[redacted]d Estimated Date of Delivery: 02/23/16  Blood pressure 100/60, pulse 92, weight 152 lb (68.947 kg), last menstrual period 05/10/2015.   refer to the ob flow sheet for FH and FHR, also BP, Wt, Urine results: all negative.  Patient reports good fetal movement and no rupture of membranes symptoms or regular contractions. Patient complaints: Pt states that 2 hours ago she slipped and fell and landed on her back and right side. She reports she has had some mild vaginal spotting since the fall along with abnormal vaginal discharge. She denies any heavy bleeding or gush of fluid after the fall. She reports pain with urination after the fall.   Physical Exam: GU: normal exam, no blood visualized in vagina. Bladder is normal. FHR is active, baby is active on ultrasound done at bedside. U/a negative for protein Examination chaperoned by Asencion Gowda.   Questions were answered. Assessment:  Plan:  Continued routine obstetrical care   F/u in 2 weeks for low risk OB as scheduled  I personally performed the services described in this documentation, which was SCRIBED in my presence. The recorded information has been reviewed and considered accurate. It has been edited as necessary during review. Tilda Burrow, MD

## 2015-08-29 NOTE — Progress Notes (Signed)
Pt worked in today for fall. Pt states that she fell on her back and her right side about two hours ago. Pt states that she has had some spotting since she fell. Pt denies any heavy bleeding or gush of fluid, but states that she did have a gush of discharge after falling and then the spotting started.

## 2015-09-05 ENCOUNTER — Ambulatory Visit (INDEPENDENT_AMBULATORY_CARE_PROVIDER_SITE_OTHER): Payer: Medicaid Other | Admitting: Advanced Practice Midwife

## 2015-09-05 ENCOUNTER — Encounter: Payer: Self-pay | Admitting: Advanced Practice Midwife

## 2015-09-05 VITALS — BP 110/74 | HR 88 | Wt 150.5 lb

## 2015-09-05 DIAGNOSIS — Z1389 Encounter for screening for other disorder: Secondary | ICD-10-CM

## 2015-09-05 DIAGNOSIS — Z331 Pregnant state, incidental: Secondary | ICD-10-CM

## 2015-09-05 DIAGNOSIS — Z3492 Encounter for supervision of normal pregnancy, unspecified, second trimester: Secondary | ICD-10-CM

## 2015-09-05 DIAGNOSIS — Z369 Encounter for antenatal screening, unspecified: Secondary | ICD-10-CM

## 2015-09-05 DIAGNOSIS — Z62810 Personal history of physical and sexual abuse in childhood: Secondary | ICD-10-CM | POA: Insufficient documentation

## 2015-09-05 DIAGNOSIS — Z363 Encounter for antenatal screening for malformations: Secondary | ICD-10-CM

## 2015-09-05 LAB — POCT URINALYSIS DIPSTICK
GLUCOSE UA: NEGATIVE
KETONES UA: NEGATIVE
Leukocytes, UA: NEGATIVE
NITRITE UA: NEGATIVE
Protein, UA: NEGATIVE
RBC UA: NEGATIVE

## 2015-09-05 MED ORDER — BUPROPION HCL ER (XL) 150 MG PO TB24: 150.0000 mg | ORAL_TABLET | Freq: Every day | ORAL | Status: DC
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
## 2015-09-05 NOTE — Progress Notes (Signed)
Pt states that she has pain since she fell.

## 2015-09-07 LAB — MATERNAL SCREEN, INTEGRATED #2
AFP MoM: 0.72
Alpha-Fetoprotein: 21.8 ng/mL
CROWN RUMP LENGTH: 68.8 mm
DIA MOM: 1.39
DIA VALUE: 242.9 pg/mL
Estriol, Unconjugated: 0.82 ng/mL
Gest. Age on Collection Date: 12.9 weeks
Gestational Age: 16 weeks
Maternal Age at EDD: 35.1 years
NUCHAL TRANSLUCENCY (NT): 1.4 mm
NUCHAL TRANSLUCENCY MOM: 0.84
Number of Fetuses: 1
PAPP-A MOM: 0.75
PAPP-A VALUE: 870.2 ng/mL
Test Results:: NEGATIVE
Weight: 146 [lb_av]
Weight: 151 [lb_av]
hCG MoM: 0.59
hCG Value: 21.1 IU/mL
uE3 MoM: 0.97

## 2015-09-11 ENCOUNTER — Encounter: Payer: Medicaid Other | Admitting: Women's Health

## 2015-09-14 ENCOUNTER — Other Ambulatory Visit: Payer: Self-pay | Admitting: Women's Health

## 2015-09-16 ENCOUNTER — Encounter (HOSPITAL_COMMUNITY): Payer: Self-pay | Admitting: *Deleted

## 2015-09-16 ENCOUNTER — Emergency Department (HOSPITAL_COMMUNITY): Payer: Medicaid Other

## 2015-09-16 ENCOUNTER — Inpatient Hospital Stay (HOSPITAL_COMMUNITY)
Admission: EM | Admit: 2015-09-16 | Discharge: 2015-09-19 | DRG: 781 | Disposition: A | Discharge status: Home / Self Care | Payer: Medicaid Other | Attending: Internal Medicine | Admitting: Internal Medicine

## 2015-09-16 DIAGNOSIS — E860 Dehydration: Secondary | ICD-10-CM | POA: Diagnosis present

## 2015-09-16 DIAGNOSIS — Z809 Family history of malignant neoplasm, unspecified: Secondary | ICD-10-CM

## 2015-09-16 DIAGNOSIS — D72829 Elevated white blood cell count, unspecified: Secondary | ICD-10-CM

## 2015-09-16 DIAGNOSIS — F17210 Nicotine dependence, cigarettes, uncomplicated: Secondary | ICD-10-CM | POA: Diagnosis present

## 2015-09-16 DIAGNOSIS — O99332 Smoking (tobacco) complicating pregnancy, second trimester: Secondary | ICD-10-CM | POA: Diagnosis present

## 2015-09-16 DIAGNOSIS — K209 Esophagitis, unspecified: Secondary | ICD-10-CM | POA: Diagnosis present

## 2015-09-16 DIAGNOSIS — Z3A17 17 weeks gestation of pregnancy: Secondary | ICD-10-CM | POA: Diagnosis present

## 2015-09-16 DIAGNOSIS — O210 Mild hyperemesis gravidarum: Secondary | ICD-10-CM | POA: Diagnosis present

## 2015-09-16 DIAGNOSIS — O211 Hyperemesis gravidarum with metabolic disturbance: Secondary | ICD-10-CM | POA: Diagnosis not present

## 2015-09-16 DIAGNOSIS — Z8249 Family history of ischemic heart disease and other diseases of the circulatory system: Secondary | ICD-10-CM

## 2015-09-16 DIAGNOSIS — Z833 Family history of diabetes mellitus: Secondary | ICD-10-CM

## 2015-09-16 DIAGNOSIS — E876 Hypokalemia: Secondary | ICD-10-CM

## 2015-09-16 DIAGNOSIS — R112 Nausea with vomiting, unspecified: Secondary | ICD-10-CM | POA: Diagnosis not present

## 2015-09-16 DIAGNOSIS — B349 Viral infection, unspecified: Secondary | ICD-10-CM | POA: Diagnosis present

## 2015-09-16 DIAGNOSIS — Z8719 Personal history of other diseases of the digestive system: Secondary | ICD-10-CM | POA: Diagnosis not present

## 2015-09-16 DIAGNOSIS — Z3490 Encounter for supervision of normal pregnancy, unspecified, unspecified trimester: Secondary | ICD-10-CM

## 2015-09-16 DIAGNOSIS — Z811 Family history of alcohol abuse and dependence: Secondary | ICD-10-CM | POA: Diagnosis not present

## 2015-09-16 DIAGNOSIS — O99112 Other diseases of the blood and blood-forming organs and certain disorders involving the immune mechanism complicating pregnancy, second trimester: Secondary | ICD-10-CM | POA: Diagnosis not present

## 2015-09-16 DIAGNOSIS — R1011 Right upper quadrant pain: Secondary | ICD-10-CM

## 2015-09-16 DIAGNOSIS — R1010 Upper abdominal pain, unspecified: Secondary | ICD-10-CM | POA: Diagnosis not present

## 2015-09-16 LAB — CBC WITH DIFFERENTIAL/PLATELET
Basophils Absolute: 0 10*3/uL (ref 0.0–0.1)
Basophils Relative: 0 %
Eosinophils Absolute: 0 10*3/uL (ref 0.0–0.7)
Eosinophils Relative: 0 %
HEMATOCRIT: 41.5 % (ref 36.0–46.0)
HEMOGLOBIN: 14.4 g/dL (ref 12.0–15.0)
LYMPHS ABS: 1.5 10*3/uL (ref 0.7–4.0)
LYMPHS PCT: 5 %
MCH: 30.6 pg (ref 26.0–34.0)
MCHC: 34.7 g/dL (ref 30.0–36.0)
MCV: 88.1 fL (ref 78.0–100.0)
MONOS PCT: 3 %
Monocytes Absolute: 0.8 10*3/uL (ref 0.1–1.0)
NEUTROS ABS: 28.8 10*3/uL — AB (ref 1.7–7.7)
NEUTROS PCT: 92 %
Platelets: 275 10*3/uL (ref 150–400)
RBC: 4.71 MIL/uL (ref 3.87–5.11)
RDW: 12.6 % (ref 11.5–15.5)
WBC: 31.2 10*3/uL — AB (ref 4.0–10.5)

## 2015-09-16 LAB — BASIC METABOLIC PANEL
Anion gap: 16 — ABNORMAL HIGH (ref 5–15)
BUN: 12 mg/dL (ref 6–20)
CHLORIDE: 102 mmol/L (ref 101–111)
CO2: 22 mmol/L (ref 22–32)
Calcium: 9 mg/dL (ref 8.9–10.3)
Creatinine, Ser: 0.71 mg/dL (ref 0.44–1.00)
GFR calc Af Amer: >60 mL/min (ref >60)
GFR calc non Af Amer: >60 mL/min (ref >60)
GLUCOSE: 155 mg/dL — AB (ref 65–99)
POTASSIUM: 3 mmol/L — AB (ref 3.5–5.1)
SODIUM: 140 mmol/L (ref 135–145)

## 2015-09-16 LAB — RAPID STREP SCREEN (MED CTR MEBANE ONLY): STREPTOCOCCUS, GROUP A SCREEN (DIRECT): NEGATIVE

## 2015-09-16 MED ORDER — PROMETHAZINE HCL 25 MG/ML IJ SOLN
12.5000 mg | Freq: Once | INTRAMUSCULAR | Status: AC
  Administered 2015-09-16: 12.5 mg via INTRAVENOUS
  Filled 2015-09-16: qty 1

## 2015-09-16 MED ORDER — SODIUM CHLORIDE 0.9 % IV BOLUS (SEPSIS)
1000.0000 mL | Freq: Once | INTRAVENOUS | Status: AC
  Administered 2015-09-16: 1000 mL via INTRAVENOUS

## 2015-09-16 MED ORDER — PROMETHAZINE HCL 25 MG/ML IJ SOLN
12.5000 mg | Freq: Once | INTRAMUSCULAR | Status: AC
  Administered 2015-09-17: 12.5 mg via INTRAVENOUS
  Filled 2015-09-16: qty 1

## 2015-09-16 MED ORDER — ONDANSETRON HCL 4 MG/2ML IJ SOLN
4.0000 mg | Freq: Once | INTRAMUSCULAR | Status: AC
  Administered 2015-09-16: 4 mg via INTRAVENOUS
  Filled 2015-09-16: qty 2

## 2015-09-16 NOTE — ED Provider Notes (Signed)
CSN: [id number]     Arrival date & time 09/16/15  1948 History  This chart was scribed for Donnetta Hutching, MD by Ronney Lion, ED Scribe. This patient was seen in room [facility] and the patient's care was started at 9:06 PM.    Chief Complaint  Patient presents with  . Emesis   The history is provided by the patient. No language interpreter was used.    HPI Comments: Mary Lowe is a 34 y.o. female who is [redacted] weeks pregnant, (G4P3), who presents to the Emergency Department complaining of multiple episodes of vomiting that began this morning, about 16 hours ago. Patient complains of an associated sore throat that began this afternoon, which she thinks may be due to the vomiting. She also complains of subjective fever and chills, headache, and dark urine/decreased urine that she attributes to dehydration. Patient notes a history of Crohn's disease and IBS. She states her LMP was in June.   Past Medical History  Diagnosis Date  . IBS (irritable bowel syndrome)   . Crohn disease    Past Surgical History  Procedure Laterality Date  . Tonsillectomy    . Ectopic pregnancy surgery     Family History  Problem Relation Age of Onset  . Depression Mother   . Depression Father   . Heart disease Father   . Alcohol abuse Father   . Hypertension Father   . Anesthesia problems Neg Hx   . Hypotension Neg Hx   . Malignant hyperthermia Neg Hx   . Pseudochol deficiency Neg Hx   . Alcohol abuse Paternal Uncle   . Diabetes Maternal Grandfather   . Alcohol abuse Paternal Grandmother   . Alcohol abuse Paternal Grandfather   . Cancer Paternal Grandfather     lung  . Cancer Maternal Grandmother    Social History  Substance Use Topics  . Smoking status: Former Smoker -- 0.50 packs/day    Types: Cigarettes  . Smokeless tobacco: Never Used  . Alcohol Use: No   OB History    Gravida Para Term Preterm AB TAB SAB Ectopic Multiple Living   0 3 0 2 1 0 3     Review of Systems  Constitutional:  Positive for fever (subjective) and chills.  HENT: Positive for sore throat.   Gastrointestinal: Positive for vomiting.  Genitourinary: Positive for decreased urine volume.  Neurological: Positive for headaches.  All other systems reviewed and are negative.     Allergies  Naproxen and Sulfa antibiotics  Home Medications   Prior to Admission medications   Medication Sig Start Date End Date Taking? Authorizing Provider  acetaminophen (TYLENOL) 500 MG tablet Take 500 mg by mouth every 6 (six) hours as needed for moderate pain.   Yes Historical Provider, MD  Prenatal Multivit-Min-Fe-FA (PRENATAL VITAMINS) 0.8 MG tablet Take 1 tablet by mouth daily. 06/18/15  Yes Dione Booze, MD  promethazine (PHENERGAN) 25 MG tablet TAKE 0.5-1 TABLETS (12.5-25 MG TOTAL) BY MOUTH EVERY 6 (SIX) HOURS AS NEEDED FOR NAUSEA OR VOMITING. 08/28/15  Yes Cheral Marker, CNM  buPROPion (WELLBUTRIN XL) 150 MG 24 hr tablet Take 1 tablet (150 mg total) by mouth daily. 09/05/15   Jacklyn Shell, CNM   BP 109/74 mmHg  Pulse 111  Temp(Src) 98.1 F (36.7 C) (Oral)  Resp 18  Ht  (1.6 m)  Wt 153 lb (69.4 kg)  BMI 27.11 kg/m2  SpO2 100%  LMP 05/10/2015 Physical Exam  Constitutional: She is oriented to  person, place, and time. She appears well-developed and well-nourished.  HENT:  Head: Normocephalic and atraumatic.  Mouth/Throat: Oropharyngeal exudate present.  Gray-appearing exudate on oropharyngeal area.   Eyes: Conjunctivae and EOM are normal. Pupils are equal, round, and reactive to light.  Neck: Normal range of motion. Neck supple.  Cardiovascular: Normal rate and regular rhythm.   Pulmonary/Chest: Effort normal and breath sounds normal.  Abdominal: Soft. Bowel sounds are normal.  Musculoskeletal: Normal range of motion.  Neurological: She is alert and oriented to person, place, and time.  Skin: Skin is warm and dry.  Psychiatric: She has a normal mood and affect. Her behavior is normal.   Nursing note and vitals reviewed.   ED Course  Procedures (including critical care time)  DIAGNOSTIC STUDIES: Oxygen Saturation is 100% on RA, normal by my interpretation.    COORDINATION OF CARE: 9:11 PM - Discussed treatment plan with pt at bedside which includes IV fluids, Phenergan, strep screen, and UA. Pt verbalized understanding and agreed to plan.    Labs Review Labs Reviewed  CBC WITH DIFFERENTIAL/PLATELET - Abnormal; Notable for the following:    WBC 31.2 (*)    Neutro Abs 28.8 (*)    All other components within normal limits  BASIC METABOLIC PANEL - Abnormal; Notable for the following:    Potassium 3.0 (*)    Glucose, Bld 155 (*)    Anion gap 16 (*)    All other components within normal limits  RAPID STREP SCREEN (NOT AT [facility])  CULTURE, GROUP A STREP  URINALYSIS, ROUTINE W REFLEX MICROSCOPIC (NOT AT [facility])    Imaging Review Dg Chest Port 1 View  09/16/2015   CLINICAL DATA:  Vomiting and chest pain. Elevated white count. Pregnancy, trimester uncertain.  EXAM: PORTABLE CHEST 1 VIEW  COMPARISON:  11/19/2013  FINDINGS: Normal heart size and mediastinal contours.  Coarsening of basilar lung markings, slightly greater on the right, favored secondary to technique. No definitive pneumonia. No effusion or air leak.  IMPRESSION: No definitive pneumonia. If ongoing concern, would obtain short follow-up to re-evaluate the right base.   Electronically Signed   By: Marnee Spring M.D.   On: 09/16/2015 23:04   I have personally reviewed and evaluated these images and lab results as part of my medical decision-making.   EKG Interpretation None     CRITICAL CARE Performed by: Donnetta Hutching Total critical care time: 30 Critical care time was exclusive of separately billable procedures and treating other patients. Critical care was necessary to treat or prevent imminent or life-threatening deterioration. Critical care was time spent personally by me on the following activities:  development of treatment plan with patient and/or surrogate as well as nursing, discussions with consultants, evaluation of patient's response to treatment, examination of patient, obtaining history from patient or surrogate, ordering and performing treatments and interventions, ordering and review of laboratory studies, ordering and review of radiographic studies, pulse oximetry and re-evaluation of patient's condition. MDM   Final diagnoses:  Pregnancy  Viral syndrome  Dehydration  Leukocytosis    I personally performed the services described in this documentation, which was scribed in my presence. The recorded information has been reviewed and is accurate.  History and physical consistent with viral syndrome. Patient is approximately [redacted] weeks pregnant. She does not appear septic. White count 31,000. Urinalysis pending. Chest x-ray shows no obvious pneumonia. Will hydrate with IV fluids. Discussed with OB on-call. Admit to general medicine.    Donnetta Hutching, MD 09/16/15 [phone]

## 2015-09-16 NOTE — ED Notes (Signed)
Pt states she has been vomiting since 5am today

## 2015-09-16 NOTE — ED Notes (Signed)
Patient unable to provide urine sample at this time

## 2015-09-17 ENCOUNTER — Encounter: Payer: Medicaid Other | Admitting: Obstetrics and Gynecology

## 2015-09-17 ENCOUNTER — Encounter (HOSPITAL_COMMUNITY): Payer: Self-pay

## 2015-09-17 DIAGNOSIS — E876 Hypokalemia: Secondary | ICD-10-CM

## 2015-09-17 DIAGNOSIS — O210 Mild hyperemesis gravidarum: Secondary | ICD-10-CM

## 2015-09-17 DIAGNOSIS — Z8719 Personal history of other diseases of the digestive system: Secondary | ICD-10-CM

## 2015-09-17 DIAGNOSIS — O211 Hyperemesis gravidarum with metabolic disturbance: Secondary | ICD-10-CM

## 2015-09-17 DIAGNOSIS — O99112 Other diseases of the blood and blood-forming organs and certain disorders involving the immune mechanism complicating pregnancy, second trimester: Secondary | ICD-10-CM

## 2015-09-17 LAB — COMPREHENSIVE METABOLIC PANEL
ALBUMIN: 2.7 g/dL — AB (ref 3.5–5.0)
ALT: 11 U/L — ABNORMAL LOW (ref 14–54)
AST: 17 U/L (ref 15–41)
Alkaline Phosphatase: 34 U/L — ABNORMAL LOW (ref 38–126)
Anion gap: 5 (ref 5–15)
BUN: 8 mg/dL (ref 6–20)
CHLORIDE: 113 mmol/L — AB (ref 101–111)
CO2: 22 mmol/L (ref 22–32)
Calcium: 7.5 mg/dL — ABNORMAL LOW (ref 8.9–10.3)
Creatinine, Ser: 0.42 mg/dL — ABNORMAL LOW (ref 0.44–1.00)
GFR calc Af Amer: >60 mL/min (ref >60)
Glucose, Bld: 126 mg/dL — ABNORMAL HIGH (ref 65–99)
POTASSIUM: 3.1 mmol/L — AB (ref 3.5–5.1)
Sodium: 140 mmol/L (ref 135–145)
Total Bilirubin: 0.5 mg/dL (ref 0.3–1.2)
Total Protein: 5.3 g/dL — ABNORMAL LOW (ref 6.5–8.1)

## 2015-09-17 LAB — CBC
HCT: 32.1 % — ABNORMAL LOW (ref 36.0–46.0)
Hemoglobin: 10.9 g/dL — ABNORMAL LOW (ref 12.0–15.0)
MCH: 30.2 pg (ref 26.0–34.0)
MCHC: 34 g/dL (ref 30.0–36.0)
MCV: 88.9 fL (ref 78.0–100.0)
PLATELETS: 212 10*3/uL (ref 150–400)
RBC: 3.61 MIL/uL — ABNORMAL LOW (ref 3.87–5.11)
RDW: 12.8 % (ref 11.5–15.5)
WBC: 23.8 10*3/uL — AB (ref 4.0–10.5)

## 2015-09-17 LAB — URINALYSIS, ROUTINE W REFLEX MICROSCOPIC
Bilirubin Urine: NEGATIVE
Glucose, UA: NEGATIVE mg/dL
Hgb urine dipstick: NEGATIVE
Ketones, ur: >80 mg/dL — AB
Leukocytes, UA: NEGATIVE
NITRITE: NEGATIVE
PROTEIN: NEGATIVE mg/dL
Specific Gravity, Urine: 1.025 (ref 1.005–1.030)
Urobilinogen, UA: 0.2 mg/dL (ref 0.0–1.0)
pH: 6 (ref 5.0–8.0)

## 2015-09-17 LAB — MAGNESIUM: MAGNESIUM: 1.6 mg/dL — AB (ref 1.7–2.4)

## 2015-09-17 MED ORDER — ACETAMINOPHEN 500 MG PO TABS
500.0000 mg | ORAL_TABLET | Freq: Four times a day (QID) | ORAL | Status: DC | PRN
Start: 2015-09-17 — End: 2015-09-19
  Filled 2015-09-17: qty 1

## 2015-09-17 MED ORDER — BUPROPION HCL ER (XL) 150 MG PO TB24
150.0000 mg | ORAL_TABLET | Freq: Every day | ORAL | Status: DC
  Filled 2015-09-17 (×5): qty 1

## 2015-09-17 MED ORDER — POTASSIUM CHLORIDE 10 MEQ/100ML IV SOLN
10.0000 meq | INTRAVENOUS | Status: AC
  Administered 2015-09-17 (×3): 10 meq via INTRAVENOUS
  Filled 2015-09-17 (×3): qty 100

## 2015-09-17 MED ORDER — MORPHINE SULFATE (PF) 2 MG/ML IV SOLN
1.0000 mg | INTRAVENOUS | Status: DC | PRN
  Administered 2015-09-17 – 2015-09-19 (×11): 1 mg via INTRAVENOUS
  Filled 2015-09-17 (×11): qty 1

## 2015-09-17 MED ORDER — PHENOL 1.4 % MT LIQD
1.0000 | OROMUCOSAL | Status: DC | PRN
  Administered 2015-09-17: 1 via OROMUCOSAL
  Filled 2015-09-17: qty 177

## 2015-09-17 MED ORDER — PROMETHAZINE HCL 25 MG/ML IJ SOLN
12.5000 mg | Freq: Four times a day (QID) | INTRAMUSCULAR | Status: DC | PRN
  Administered 2015-09-17 – 2015-09-19 (×7): 12.5 mg via INTRAVENOUS
  Filled 2015-09-17 (×7): qty 1

## 2015-09-17 MED ORDER — ENOXAPARIN SODIUM 40 MG/0.4ML ~~LOC~~ SOLN
40.0000 mg | SUBCUTANEOUS | Status: DC
  Administered 2015-09-17: 40 mg via SUBCUTANEOUS
  Filled 2015-09-17: qty 0.4

## 2015-09-17 MED ORDER — POTASSIUM CHLORIDE 10 MEQ/100ML IV SOLN
10.0000 meq | INTRAVENOUS | Status: AC
  Administered 2015-09-17 (×2): 10 meq via INTRAVENOUS
  Filled 2015-09-17: qty 100

## 2015-09-17 MED ORDER — INFLUENZA VAC SPLIT QUAD 0.5 ML IM SUSY: 0.5000 mL | PREFILLED_SYRINGE | INTRAMUSCULAR | Status: DC

## 2015-09-17 MED ORDER — SODIUM CHLORIDE 0.9 % IV SOLN: INTRAVENOUS | Status: DC

## 2015-09-17 MED ORDER — POTASSIUM CHLORIDE 10 MEQ/100ML IV SOLN
10.0000 meq | Freq: Once | INTRAVENOUS | Status: AC
  Administered 2015-09-17: 10 meq via INTRAVENOUS

## 2015-09-17 MED ORDER — BOOST / RESOURCE BREEZE PO LIQD
1.0000 | Freq: Three times a day (TID) | ORAL | Status: DC
  Administered 2015-09-17: 1 via ORAL

## 2015-09-17 MED ORDER — LIDOCAINE VISCOUS 2 % MT SOLN
15.0000 mL | OROMUCOSAL | Status: DC | PRN
  Administered 2015-09-17: 15 mL via OROMUCOSAL
  Filled 2015-09-17: qty 15

## 2015-09-17 MED ORDER — ALUM HYDROXIDE-MAG TRISILICATE 80-20 MG PO CHEW
2.0000 | CHEWABLE_TABLET | ORAL | Status: DC | PRN
  Filled 2015-09-17: qty 2

## 2015-09-17 MED ORDER — PANTOPRAZOLE SODIUM 40 MG IV SOLR
40.0000 mg | Freq: Every day | INTRAVENOUS | Status: DC
  Administered 2015-09-17 – 2015-09-18 (×2): 40 mg via INTRAVENOUS
  Filled 2015-09-17 (×2): qty 40

## 2015-09-17 MED ORDER — KCL IN DEXTROSE-NACL 20-5-0.9 MEQ/L-%-% IV SOLN
INTRAVENOUS | Status: DC
  Administered 2015-09-17 – 2015-09-19 (×3): via INTRAVENOUS

## 2015-09-17 MED ORDER — ONDANSETRON HCL 4 MG/2ML IJ SOLN
4.0000 mg | INTRAMUSCULAR | Status: DC | PRN
  Administered 2015-09-17 – 2015-09-19 (×3): 4 mg via INTRAVENOUS
  Filled 2015-09-17 (×4): qty 2

## 2015-09-17 NOTE — Progress Notes (Signed)
Chart reviewed and patient examined.  S: continues with nausea and vomiting. Mostly dry heaves  A/P Hyperemesis gravidarum No improvement. Reports no hx of same. Continue IV fluids. Continue phenergan. OB/GYN consultation pending. monitor  Hypokalemia Related to #1. Potassium 3.1 in spite IV supplementation. Will provide 3 more runs. await  Sore throat Group A rapid strep is negative. Improved this am. Afebrile and non-toxic appearing. Continue supportive therapy.  Leukocytosis Etiology unclear. May be reactive. Afebrile and non-toxic appearing. Urinalysis unremarkable. Chest xray unremarkable.       PE Gen: lying in bed dry heaving.  CV: RRR no MGR No LE edema Resp: normal effort BS clear bilaterally no wheeze Abdomen: non-distended +BS non-tender to palpation    Mary Chris. Black, NP

## 2015-09-17 NOTE — Care Management Note (Signed)
Case Management Note  Patient Details  Name: SHAVAUGHN WENTWORTH MRN: [id number] Date of Birth: 1981-04-17  Subjective/Objective:                  Pt admitted from home with intractable nausea and vomiting. Pt lives with her husband and will return home at discharge.  Action/Plan: No CM needs noted.  Expected Discharge Date:                  Expected Discharge Plan:  Home/Self Care  In-House Referral:  NA  Discharge planning Services  CM Consult  Post Acute Care Choice:  NA Choice offered to:  NA  DME Arranged:    DME Agency:     HH Arranged:    HH Agency:     Status of Service:  Completed, signed off  Medicare Important Message Given:    Date Medicare IM Given:    Medicare IM give by:    Date Additional Medicare IM Given:    Additional Medicare Important Message give by:     If discussed at Long Length of Stay Meetings, dates discussed:    Additional Comments:  Cheryl Flash, RN 09/17/2015, 1:43 PM

## 2015-09-17 NOTE — H&P (Addendum)
PCP:   PROVIDER NOT IN SYSTEM   Chief Complaint:  Vomiting and sore throat  HPI: 34 year old female who   has a past medical history of IBS (irritable bowel syndrome) and Crohn disease. today comes to hospital with complaints of vomiting which started yesterday morning. Patient says that the vomiting was intractable. She could not keep anything down also complains of burning of the chest and sore throat from constant vomiting. Patient is [redacted] weeks pregnant G4 P3, and developed morning sickness within 2 weeks of getting pregnant in June. Patient has been intermittently having nausea and vomiting but this has been the worse episode. She denies shortness of breath, admits to having coughing up green-colored phlegm. She does smoke cigarettes. Denies fever, no dysuria urgency frequency of urination. UA is clear. Rapid strep is negative. Patient also found to have leukocytosis of 31,000 along with hypokalemia potassium 3.0. OB/GYN was consulted by the ED physician and they recommended patient to be treated at [facility].    Allergies:   Allergies  Allergen Reactions  . Naproxen Nausea And Vomiting  . Sulfa Antibiotics Hives      Past Medical History  Diagnosis Date  . IBS (irritable bowel syndrome)   . Crohn disease     Past Surgical History  Procedure Laterality Date  . Tonsillectomy    . Ectopic pregnancy surgery      Prior to Admission medications   Medication Sig Start Date End Date Taking? Authorizing Provider  acetaminophen (TYLENOL) 500 MG tablet Take 500 mg by mouth every 6 (six) hours as needed for moderate pain.   Yes Historical Provider, MD  Prenatal Multivit-Min-Fe-FA (PRENATAL VITAMINS) 0.8 MG tablet Take 1 tablet by mouth daily. 06/18/15  Yes Dione Booze, MD  promethazine (PHENERGAN) 25 MG tablet TAKE 0.5-1 TABLETS (12.5-25 MG TOTAL) BY MOUTH EVERY 6 (SIX) HOURS AS NEEDED FOR NAUSEA OR VOMITING. 08/28/15  Yes Cheral Marker, CNM  buPROPion (WELLBUTRIN XL) 150 MG 24  hr tablet Take 1 tablet (150 mg total) by mouth daily. 09/05/15   Jacklyn Shell, CNM    Social History:  reports that she has quit smoking. Her smoking use included Cigarettes. She smoked 0.50 packs per day. She has never used smokeless tobacco. She reports that she does not drink alcohol or use illicit drugs.  Family History  Problem Relation Age of Onset  . Depression Mother   . Depression Father   . Heart disease Father   . Alcohol abuse Father   . Hypertension Father   . Anesthesia problems Neg Hx   . Hypotension Neg Hx   . Malignant hyperthermia Neg Hx   . Pseudochol deficiency Neg Hx   . Alcohol abuse Paternal Uncle   . Diabetes Maternal Grandfather   . Alcohol abuse Paternal Grandmother   . Alcohol abuse Paternal Grandfather   . Cancer Paternal Grandfather     lung  . Cancer Maternal Patton Salles Weights   09/16/15 1952  Weight: 69.4 kg (153 lb)    All the positives are listed in BOLD  Review of Systems:  HEENT: Headache, blurred vision, runny nose, sore throat Neck: Hypothyroidism, hyperthyroidism,,lymphadenopathy Chest : Shortness of breath, history of COPD, Asthma Heart : Chest pain, history of coronary arterey disease GI:  Nausea, vomiting, diarrhea, constipation, GERD GU: Dysuria, urgency, frequency of urination, hematuria Neuro: Stroke, seizures, syncope Psych: Depression, anxiety, hallucinations   Physical Exam: Blood pressure 103/55, pulse 103, temperature 98.1 F (36.7 C), temperature source  Oral, resp. rate 18, height 5\' 3"  (1.6 m), weight 69.4 kg (153 lb), last menstrual period 05/10/2015, SpO2 98 %. Constitutional:   Patient is a well-developed and well-nourished female* in no acute distress and cooperative with exam. Head: Normocephalic and atraumatic Mouth: Mucus membranes moist Throat- Uvula edematous, mild pharyngeal erythema Eyes: PERRL, EOMI, conjunctivae normal Neck: Supple, No Thyromegaly Cardiovascular: RRR, S1  normal, S2 normal Pulmonary/Chest: CTAB, no wheezes, rales, or rhonchi Abdominal: Soft. Non-tender, non-distended, bowel sounds are normal, no masses, organomegaly, or guarding present.  Neurological: A&O x3, Strength is normal and symmetric bilaterally, cranial nerve II-XII are grossly intact, no focal motor deficit, sensory intact to light touch bilaterally.  Extremities : No Cyanosis, Clubbing or Edema  Labs on Admission:  Basic Metabolic Panel:  Recent Labs Lab 09/16/15 2040  NA 140  K 3.0*  CL 102  CO2 22  GLUCOSE 155*  BUN 12  CREATININE 0.71  CALCIUM 9.0   CBC:  Recent Labs Lab 09/16/15 2040  WBC 31.2*  NEUTROABS 28.8*  HGB 14.4  HCT 41.5  MCV 88.1  PLT 275    Radiological Exams on Admission: Dg Chest Port 1 View  09/16/2015   CLINICAL DATA:  Vomiting and chest pain. Elevated white count. Pregnancy, trimester uncertain.  EXAM: PORTABLE CHEST 1 VIEW  COMPARISON:  11/19/2013  FINDINGS: Normal heart size and mediastinal contours.  Coarsening of basilar lung markings, slightly greater on the right, favored secondary to technique. No definitive pneumonia. No effusion or air leak.  IMPRESSION: No definitive pneumonia. If ongoing concern, would obtain short follow-up to re-evaluate the right base.   Electronically Signed   By: Marnee Spring M.D.   On: 09/16/2015 23:04       Assessment/Plan Active Problems:   Viral syndrome   Hyperemesis gravidarum   Hypokalemia  Hyperemesis gravidarum We'll admit the patient for intractable vomiting and platelet abnormalities. Start IV normal saline at 150 mL per hour. Replace potassium with IV KCl 10 mg IV 3. Repeat BMP in a.m. Phenergan 12.5 mg IV every 6 hours when necessary for nausea and vomiting. Will check serum magnesium level. OB/GYN to be consulted in a.m.  Hypokalemia As above Will replace the potassium with IV 10 mg KCl 3.  Sore throat Group A rapid strep is negative. Will given phenol  spray when necessary  for sore throat.  Leukocytosis Likely from dehydration and stress. Check cbc in am.  Code status: Full code  Family discussion: Admission, patients condition and plan of care including tests being ordered have been discussed with the patient and *her husband at bedside* who indicate understanding and agree with the plan and Code Status.   Time Spent on Admission: 60 min  Herley Bernardini S [facility] Pager: [phone] 09/17/2015, 12:40 AM  If 7PM-7AM, please contact night-coverage  [email or web address]  Password [email or web address]

## 2015-09-17 NOTE — ED Notes (Signed)
Dr. Lama in with pt at this time 

## 2015-09-17 NOTE — ED Notes (Signed)
Report given to [facility] on 300

## 2015-09-17 NOTE — Progress Notes (Signed)
Patient ID: Mary Lowe, female   DOB: [birth year], 34 y.o.   MRN: [id number] Reason for Consult:pregnant, severe vomiting,  Referring Physician: Hospitalist service  Mary Lowe is an 34 y.o. female with medical hx of Crohns disease, now admitted with leukocytosis (31K), and nausea and vomiting. She was well hydrated overnight, adn feels better, with less abdominal tenderness, but has a difficult time swallowing with food sensation of getting stuck at upper aspect of sternum and gradually descending pain to stomach. Pt describes esophagus as "sore" such that she is without appetite and she experiences pain with swallowing.  Pertinent Gynecological History: MOB History: G7, P3033   Menstrual History: Menarche age:  Patient's last menstrual period was 05/10/2015.    Past Medical History  Diagnosis Date  . IBS (irritable bowel syndrome)   . Crohn disease     Past Surgical History  Procedure Laterality Date  . Tonsillectomy    . Ectopic pregnancy surgery      Family History  Problem Relation Age of Onset  . Depression Mother   . Depression Father   . Heart disease Father   . Alcohol abuse Father   . Hypertension Father   . Anesthesia problems Neg Hx   . Hypotension Neg Hx   . Malignant hyperthermia Neg Hx   . Pseudochol deficiency Neg Hx   . Alcohol abuse Paternal Uncle   . Diabetes Maternal Grandfather   . Alcohol abuse Paternal Grandmother   . Alcohol abuse Paternal Grandfather   . Cancer Paternal Grandfather     lung  . Cancer Maternal Grandmother     Social History:  reports that she has quit smoking. Her smoking use included Cigarettes. She smoked 0.50 packs per day. She has never used smokeless tobacco. She reports that she does not drink alcohol or use illicit drugs.  Allergies:  Allergies  Allergen Reactions  . Naproxen Nausea And Vomiting  . Sulfa Antibiotics Hives    Medications: I have reviewed the patient's current medications.  ROS  Blood  pressure 98/59, pulse 87, temperature 99.2 F (37.3 C), temperature source Oral, resp. rate 17, height $RemoveBe'5\' 3"'TYqRaxDGS$  (1.6 m), weight 153 lb (69.4 kg), last menstrual period 05/10/2015, SpO2 100 %. Physical Exam  Constitutional: She is oriented to person, place, and time. She appears well-developed and well-nourished.  Epigastric and sub xyphoid pain.  HENT:  Head: Normocephalic.  Eyes: Pupils are equal, round, and reactive to light.  Neck: Neck supple. No thyromegaly present.  Cardiovascular: Normal rate.   Respiratory: Effort normal.  GI: Soft. Bowel sounds are normal. She exhibits no distension. There is no tenderness. There is no rebound.  Musculoskeletal: Normal range of motion.  Neurological: She is alert and oriented to person, place, and time.    Results for orders placed or performed during the hospital encounter of 09/16/15 (from the past 48 hour(s))  CBC with Differential     Status: Abnormal   Collection Time: 09/16/15  8:40 PM  Result Value Ref Range   WBC 31.2 (H) 4.0 - 10.5 K/uL   RBC 4.71 3.87 - 5.11 MIL/uL   Hemoglobin 14.4 12.0 - 15.0 g/dL   HCT 41.5 36.0 - 46.0 %   MCV 88.1 78.0 - 100.0 fL   MCH 30.6 26.0 - 34.0 pg   MCHC 34.7 30.0 - 36.0 g/dL   RDW 12.6 11.5 - 15.5 %   Platelets 275 150 - 400 K/uL   Neutrophils Relative % 92 %   Neutro Abs 28.8 (  H) 1.7 - 7.7 K/uL   Lymphocytes Relative 5 %   Lymphs Abs 1.5 0.7 - 4.0 K/uL   Monocytes Relative 3 %   Monocytes Absolute 0.8 0.1 - 1.0 K/uL   Eosinophils Relative 0 %   Eosinophils Absolute 0.0 0.0 - 0.7 K/uL   Basophils Relative 0 %   Basophils Absolute 0.0 0.0 - 0.1 K/uL  Basic metabolic panel     Status: Abnormal   Collection Time: 09/16/15  8:40 PM  Result Value Ref Range   Sodium 140 135 - 145 mmol/L   Potassium 3.0 (L) 3.5 - 5.1 mmol/L   Chloride 102 101 - 111 mmol/L   CO2 22 22 - 32 mmol/L   Glucose, Bld 155 (H) 65 - 99 mg/dL   BUN 12 6 - 20 mg/dL   Creatinine, Ser 0.71 0.44 - 1.00 mg/dL   Calcium 9.0 8.9 -  10.3 mg/dL   GFR calc non Af Amer >60 >60 mL/min   GFR calc Af Amer >60 >60 mL/min    Comment: (NOTE) The eGFR has been calculated using the CKD EPI equation. This calculation has not been validated in all clinical situations. eGFR's persistently <60 mL/min signify possible Chronic Kidney Disease.    Anion gap 16 (H) 5 - 15  Rapid strep screen     Status: None   Collection Time: 09/16/15  9:19 PM  Result Value Ref Range   Streptococcus, Group A Screen (Direct) NEGATIVE NEGATIVE    Comment: (NOTE) A Rapid Antigen test may result negative if the antigen level in the sample is below the detection level of this test. The FDA has not cleared this test as a stand-alone test therefore the rapid antigen negative result has reflexed to a Group A Strep culture.   Urinalysis, Routine w reflex microscopic     Status: Abnormal   Collection Time: 09/16/15 11:55 PM  Result Value Ref Range   Color, Urine YELLOW YELLOW   APPearance CLEAR CLEAR   Specific Gravity, Urine 1.025 1.005 - 1.030   pH 6.0 5.0 - 8.0   Glucose, UA NEGATIVE NEGATIVE mg/dL   Hgb urine dipstick NEGATIVE NEGATIVE   Bilirubin Urine NEGATIVE NEGATIVE   Ketones, ur >80 (A) NEGATIVE mg/dL   Protein, ur NEGATIVE NEGATIVE mg/dL   Urobilinogen, UA 0.2 0.0 - 1.0 mg/dL   Nitrite NEGATIVE NEGATIVE   Leukocytes, UA NEGATIVE NEGATIVE    Comment: MICROSCOPIC NOT DONE ON URINES WITH NEGATIVE PROTEIN, BLOOD, LEUKOCYTES, NITRITE, OR GLUCOSE <1000 mg/dL.  CBC     Status: Abnormal   Collection Time: 09/17/15  6:19 AM  Result Value Ref Range   WBC 23.8 (H) 4.0 - 10.5 K/uL   RBC 3.61 (L) 3.87 - 5.11 MIL/uL   Hemoglobin 10.9 (L) 12.0 - 15.0 g/dL    Comment: DELTA CHECK NOTED RESULT REPEATED AND VERIFIED    HCT 32.1 (L) 36.0 - 46.0 %   MCV 88.9 78.0 - 100.0 fL   MCH 30.2 26.0 - 34.0 pg   MCHC 34.0 30.0 - 36.0 g/dL   RDW 12.8 11.5 - 15.5 %   Platelets 212 150 - 400 K/uL  Comprehensive metabolic panel     Status: Abnormal   Collection  Time: 09/17/15  6:19 AM  Result Value Ref Range   Sodium 140 135 - 145 mmol/L   Potassium 3.1 (L) 3.5 - 5.1 mmol/L   Chloride 113 (H) 101 - 111 mmol/L   CO2 22 22 - 32 mmol/L   Glucose,  Bld 126 (H) 65 - 99 mg/dL   BUN 8 6 - 20 mg/dL   Creatinine, Ser 0.42 (L) 0.44 - 1.00 mg/dL   Calcium 7.5 (L) 8.9 - 10.3 mg/dL   Total Protein 5.3 (L) 6.5 - 8.1 g/dL   Albumin 2.7 (L) 3.5 - 5.0 g/dL   AST 17 15 - 41 U/L   ALT 11 (L) 14 - 54 U/L   Alkaline Phosphatase 34 (L) 38 - 126 U/L   Total Bilirubin 0.5 0.3 - 1.2 mg/dL   GFR calc non Af Amer >60 >60 mL/min   GFR calc Af Amer >60 >60 mL/min    Comment: (NOTE) The eGFR has been calculated using the CKD EPI equation. This calculation has not been validated in all clinical situations. eGFR's persistently <60 mL/min signify possible Chronic Kidney Disease.    Anion gap 5 5 - 15  Magnesium     Status: Abnormal   Collection Time: 09/17/15  6:19 AM  Result Value Ref Range   Magnesium 1.6 (L) 1.7 - 2.4 mg/dL    Dg Chest Port 1 View  09/16/2015   CLINICAL DATA:  Vomiting and chest pain. Elevated white count. Pregnancy, trimester uncertain.  EXAM: PORTABLE CHEST 1 VIEW  COMPARISON:  11/19/2013  FINDINGS: Normal heart size and mediastinal contours.  Coarsening of basilar lung markings, slightly greater on the right, favored secondary to technique. No definitive pneumonia. No effusion or air leak.  IMPRESSION: No definitive pneumonia. If ongoing concern, would obtain short follow-up to re-evaluate the right base.   Electronically Signed   By: Monte Fantasia M.D.   On: 09/16/2015 23:04    Assessment/Plan: 1. Esophagitis 2 Hx Crohns 3 pregnancy 17 wk, stable  Plan: Switch fluids to Dextrose containing plus K 20 mEq/ l           Protonix          Antacids/gaviscon          Viscous xylocaine Check cbc with diff, amylase lipase in am   Panagiotis Oelkers V 09/17/2015

## 2015-09-18 ENCOUNTER — Inpatient Hospital Stay (HOSPITAL_COMMUNITY): Payer: Medicaid Other

## 2015-09-18 DIAGNOSIS — D72829 Elevated white blood cell count, unspecified: Secondary | ICD-10-CM | POA: Insufficient documentation

## 2015-09-18 DIAGNOSIS — R1010 Upper abdominal pain, unspecified: Secondary | ICD-10-CM

## 2015-09-18 DIAGNOSIS — E876 Hypokalemia: Secondary | ICD-10-CM

## 2015-09-18 DIAGNOSIS — K209 Esophagitis, unspecified: Secondary | ICD-10-CM

## 2015-09-18 LAB — CBC
HEMATOCRIT: 28.5 % — AB (ref 36.0–46.0)
HEMOGLOBIN: 9.7 g/dL — AB (ref 12.0–15.0)
MCH: 30.6 pg (ref 26.0–34.0)
MCHC: 34 g/dL (ref 30.0–36.0)
MCV: 89.9 fL (ref 78.0–100.0)
Platelets: 186 10*3/uL (ref 150–400)
RBC: 3.17 MIL/uL — ABNORMAL LOW (ref 3.87–5.11)
RDW: 12.9 % (ref 11.5–15.5)
WBC: 12.8 10*3/uL — ABNORMAL HIGH (ref 4.0–10.5)

## 2015-09-18 LAB — LIPASE, BLOOD: Lipase: 24 U/L (ref 22–51)

## 2015-09-18 LAB — BASIC METABOLIC PANEL
Anion gap: 3 — ABNORMAL LOW (ref 5–15)
BUN: 5 mg/dL — AB (ref 6–20)
CHLORIDE: 111 mmol/L (ref 101–111)
CO2: 23 mmol/L (ref 22–32)
CREATININE: 0.39 mg/dL — AB (ref 0.44–1.00)
Calcium: 7.5 mg/dL — ABNORMAL LOW (ref 8.9–10.3)
GFR calc Af Amer: >60 mL/min (ref >60)
GFR calc non Af Amer: >60 mL/min (ref >60)
Glucose, Bld: 101 mg/dL — ABNORMAL HIGH (ref 65–99)
Potassium: 3.4 mmol/L — ABNORMAL LOW (ref 3.5–5.1)
SODIUM: 137 mmol/L (ref 135–145)

## 2015-09-18 LAB — AMYLASE: Amylase: 39 U/L (ref 28–100)

## 2015-09-18 LAB — MAGNESIUM: Magnesium: 1.8 mg/dL (ref 1.7–2.4)

## 2015-09-18 MED ORDER — ALUM & MAG HYDROXIDE-SIMETH 200-200-20 MG/5ML PO SUSP: 30.0000 mL | ORAL | Status: DC | PRN

## 2015-09-18 MED ORDER — POTASSIUM CHLORIDE 10 MEQ/100ML IV SOLN
10.0000 meq | INTRAVENOUS | Status: AC
  Administered 2015-09-18: 10 meq via INTRAVENOUS
  Filled 2015-09-18: qty 100

## 2015-09-18 NOTE — Progress Notes (Addendum)
Initial Nutrition Assessment  DOCUMENTATION CODES:   Not applicable  INTERVENTION:  - Monitor patient for re-feeding risk due to prolonged hypocaloric feeding: monitor K, P, MG labs for 3/days.  - Change flavor of Boost Breeze TID between meals to wild berry (each supplement includes: 250 kcal, 9 g protein). - RD to continue to monitor for diet advancement and needs.   NUTRITION DIAGNOSIS:   Inadequate oral intake related to nausea, vomiting, poor appetite as evidenced by meal completion < 25%.    GOAL:   Patient will meet greater than or equal to 90% of their needs, Weight gain    MONITOR:   PO intake, Supplement acceptance, Diet advancement, Labs, Weight trends, Skin, I & O's  REASON FOR ASSESSMENT:   Malnutrition Screening Tool    ASSESSMENT:   9/27 Ms Mcloughlin, 34 yr old female who is [redacted] weeks pregnant and was admitted for extreme vomiting and sore throat. She was diagnosed with hyper-emesis gravidarum. She has previous med hx of IBS and Crohns..  Patient alert with husband present during assessment for MST. Pre-pregnancy BMI was 22.5, normal. Patient's hyper-emesis has improved. Patient is on clear liquid diet, with meal completion ~ 25%. Patient has a burning sensation in throat which is aggravated by foods. Throat soreness began on Sunday, and she has had poor PO intake since. She is drinking liquids and clear sodas throughout the day - ginger ale was recommended to help with nausea. Patient currently has a poor appetite.   Patient described that she normally eats small meals and snacks throughout the day. Breakfast may include fruit or eggs. Snacks include peanuts / peanut butter and graham-crackers. She likes yogurt and ice cream. Patient described nausea related to food aromas - RD student encouraged patient to try cold foods - such as ice cream or sandwiches which may decrease nausea. Patient is taking a [place] vitamin at home as she sated "prenatal vitamin are  hard to digest." Patient has BB ordered TID, but does not like the peach flavor, but willing to try another flavor, such as wild berry.  Patient's current weight is: 153 lbs, which is an increase of 26 lbs from her pre-pregnancy weight of 128 lbs (per patient). Her recommended pregnancy weight gain is 25-35 lbs. Her weight has been fluctuating a few lbs up and down over the course of her pregnancy.   RD student educated patient on foods to help manage morning sickness symptoms - including bland diet, small frequent meals, easy to digest foods, ginger / ginger ale, and cold foods to help reduce odor related nausea.  Labs reviewed: low K, low Ca, high WBC, low RBC, low Heme, low HCT, low BUN, low Cr, low ALT, low Alk Phos  Medications reviewed: Maalox/Mylanta, Phenergan, Chloraseptic, Prenatal Vitamin  Diet Order:  Diet clear liquid Room service appropriate?: Yes; Fluid consistency:: Thin  Skin:  Reviewed, no issues  Last BM:  09/16/15  Height:   Ht Readings from Last 1 Encounters:  09/16/15 $RemoveB'5\' 3"'asrbAElQ$  (1.6 m)    Weight:   Wt Readings from Last 1 Encounters:  09/16/15 153 lb (69.4 kg)    Ideal Body Weight:  52.3 kg  BMI:  Body mass index is 27.11 kg/(m^2).  Estimated Nutritional Needs:   Kcal:  1500-1700 kcal  Protein:  70-80g  Fluid:  2-2.3 L/day  EDUCATION NEEDS:   Education needs addressed  Kayleen Memos, BA., BS. Dietetic Intern

## 2015-09-18 NOTE — Progress Notes (Signed)
Subjective: Patient reports nausea.  And continued difficulty swallowing.  Objective: I have reviewed patient's vital signs, medications and labs.  General: alert, cooperative and mild distress GI: ruq discomfort , equivocal Murphy's sign upon deep inspiration and palpation Extremities: Homans sign is negative, no sign of DVT Vaginal Bleeding: none   Assessment/Plan: Pregnancy 17w 3 d stable Upper abd pain,  R/o GB disease Esophagitis Hx crohns disease. Plan  Check GB u/s and ob limited today Continue IV fluids, clear liquids   LOS: 2 days    FERGUSON,JOHN V 09/18/2015, 10:35 AM

## 2015-09-18 NOTE — Progress Notes (Signed)
[facility] PROGRESS NOTE  Mary Lowe WUJ:[id number] DOB: September 23, 1981 DOA: 09/16/2015 PCP: PROVIDER NOT IN SYSTEM  Assessment/Plan: Hyperemesis gravidarum Some improvement this am. Complains pain "burning" with swallowing. Evaluated by Ob/Gyn who opines esophagitis and recommends Protonix Gavison tablets. Will continue fluids. Continue phenergan. Encourage clear liquids. Will mobilize.  monitor  Hypokalemia Related to #1. Potassium 3.4.. Will supplement in IV.   Sore throat Group A rapid strep is negative. Afebrile and non-toxic appearing. See #1.  Continue supportive therapy.  Leukocytosis Etiology unclear. May be reactive. Continues to trend downward. Remains afebrile and non-toxic appearing. Urinalysis unremarkable. Chest xray unremarkable.    Code Status: full Family Communication: husband at bedside Disposition Plan: home when ready hopefully 24 hours   Consultants:  OB/GYN  Procedures:  none  Antibiotics:  none  HPI/Subjective: Sitting up in bed. Reports nausea better "as long as i dont eat anything" related pain with swalling  Objective: Filed Vitals:   09/17/15 2051  BP: 98/59  Pulse: 87  Temp: 99.2 F (37.3 C)  Resp: 17    Intake/Output Summary (Last 24 hours) at 09/18/15 1017 Last data filed at 09/18/15 0844  Gross per 24 hour  Intake 1438.33 ml  Output      5 ml  Net 1433.33 ml   Filed Weights   09/16/15 1952  Weight: 69.4 kg (153 lb)    Exam:   General:  Well nourished appears more comfortable today  Cardiovascular: RRR no m/g/r no LE edema  Respiratory: normal effort BS clear bilaterally  Abdomen: non-distended non-tender +BS  Musculoskeletal: joints without swelling/erythema   Data Reviewed: Basic Metabolic Panel:  Recent Labs Lab 09/16/15 2040 09/17/15 0619 09/18/15 0448  NA 140 140 137  K 3.0* 3.1* 3.4*  CL 102 113* 111  CO2 GLUCOSE 155* 126* 101*  BUN 12 8 5*  CREATININE 0.71 0.42* 0.39*   CALCIUM 9.0 7.5* 7.5*  MG  --  1.6* 1.8   Liver Function Tests:  Recent Labs Lab 09/17/15 0619  AST 17  ALT 11*  ALKPHOS 34*  BILITOT 0.5  PROT 5.3*  ALBUMIN 2.7*    Recent Labs Lab 09/18/15 0448  LIPASE 24  AMYLASE 39   No results for input(s): AMMONIA in the last 168 hours. CBC:  Recent Labs Lab 09/16/15 2040 09/17/15 0619 09/18/15 0448  WBC 31.2* 23.8* 12.8*  NEUTROABS 28.8*  --   --   HGB 14.4 10.9* 9.7*  HCT 41.5 32.1* 28.5*  MCV 88.1 88.9 89.9  PLT 275 212 186   Cardiac Enzymes: No results for input(s): CKTOTAL, CKMB, CKMBINDEX, TROPONINI in the last 168 hours. BNP (last 3 results) No results for input(s): BNP in the last 8760 hours.  ProBNP (last 3 results) No results for input(s): PROBNP in the last 8760 hours.  CBG: No results for input(s): GLUCAP in the last 168 hours.  Recent Results (from the past 240 hour(s))  Rapid strep screen     Status: None   Collection Time: 09/16/15  9:19 PM  Result Value Ref Range Status   Streptococcus, Group A Screen (Direct) NEGATIVE NEGATIVE Final    Comment: (NOTE) A Rapid Antigen test may result negative if the antigen level in the sample is below the detection level of this test. The FDA has not cleared this test as a stand-alone test therefore the rapid antigen negative result has reflexed to a Group A Strep culture.      Studies: Dg Chest Port 1  View  09/16/2015   CLINICAL DATA:  Vomiting and chest pain. Elevated white count. Pregnancy, trimester uncertain.  EXAM: PORTABLE CHEST 1 VIEW  COMPARISON:  11/19/2013  FINDINGS: Normal heart size and mediastinal contours.  Coarsening of basilar lung markings, slightly greater on the right, favored secondary to technique. No definitive pneumonia. No effusion or air leak.  IMPRESSION: No definitive pneumonia. If ongoing concern, would obtain short follow-up to re-evaluate the right base.   Electronically Signed   By: Marnee Spring M.D.   On: 09/16/2015 23:04     Scheduled Meds: . buPROPion  150 mg Oral Daily  . enoxaparin (LOVENOX) injection  40 mg Subcutaneous Q24H  . feeding supplement  1 Container Oral TID BM  . Influenza vac split quadrivalent PF  0.5 mL Intramuscular Tomorrow-1000  . pantoprazole (PROTONIX) IV  40 mg Intravenous QHS  . potassium chloride  10 mEq Intravenous Q1 Hr x 2   Continuous Infusions: . sodium chloride 125 mL/hr at 09/17/15 0102  . dextrose 5 % and 0.9 % NaCl with KCl 20 mEq/L 100 mL/hr at 09/18/15 1308    Active Problems:   Viral syndrome   Hyperemesis gravidarum   Hypokalemia    Time spent: 35 minutes    [facility] M  [facility] Pager [phone]. If 7PM-7AM, please contact night-coverage at [email or web address], password [facility] 09/18/2015, 10:17 AM  LOS: 2 days

## 2015-09-19 LAB — BASIC METABOLIC PANEL
ANION GAP: 7 (ref 5–15)
CO2: 21 mmol/L — ABNORMAL LOW (ref 22–32)
Calcium: 8.1 mg/dL — ABNORMAL LOW (ref 8.9–10.3)
Chloride: 112 mmol/L — ABNORMAL HIGH (ref 101–111)
Creatinine, Ser: 0.4 mg/dL — ABNORMAL LOW (ref 0.44–1.00)
GFR calc Af Amer: >60 mL/min (ref >60)
Glucose, Bld: 86 mg/dL (ref 65–99)
POTASSIUM: 3.8 mmol/L (ref 3.5–5.1)
SODIUM: 140 mmol/L (ref 135–145)

## 2015-09-19 LAB — CBC
HCT: 30.5 % — ABNORMAL LOW (ref 36.0–46.0)
Hemoglobin: 10.2 g/dL — ABNORMAL LOW (ref 12.0–15.0)
MCH: 30.1 pg (ref 26.0–34.0)
MCHC: 33.4 g/dL (ref 30.0–36.0)
MCV: 90 fL (ref 78.0–100.0)
PLATELETS: 174 10*3/uL (ref 150–400)
RBC: 3.39 MIL/uL — AB (ref 3.87–5.11)
RDW: 12.8 % (ref 11.5–15.5)
WBC: 10.2 10*3/uL (ref 4.0–10.5)

## 2015-09-19 LAB — CULTURE, GROUP A STREP: STREP A CULTURE: NEGATIVE

## 2015-09-19 MED ORDER — PROMETHAZINE HCL 25 MG/ML IJ SOLN: 6.2500 mg | Freq: Three times a day (TID) | INTRAMUSCULAR | Status: DC | PRN

## 2015-09-19 MED ORDER — MORPHINE SULFATE (PF) 2 MG/ML IV SOLN
1.0000 mg | Freq: Four times a day (QID) | INTRAVENOUS | Status: DC | PRN
  Administered 2015-09-19: 1 mg via INTRAVENOUS
  Filled 2015-09-19: qty 1

## 2015-09-19 MED ORDER — DOCUSATE SODIUM 100 MG PO CAPS: 100.0000 mg | ORAL_CAPSULE | Freq: Every day | ORAL | Status: DC | PRN

## 2015-09-19 MED ORDER — DOCUSATE SODIUM 100 MG PO CAPS
100.0000 mg | ORAL_CAPSULE | Freq: Every day | ORAL | Status: DC | PRN
  Administered 2015-09-19: 100 mg via ORAL
  Filled 2015-09-19: qty 1

## 2015-09-19 MED ORDER — SUCRALFATE 1 GM/10ML PO SUSP
1.0000 g | Freq: Three times a day (TID) | ORAL | Status: DC
  Administered 2015-09-19: 1 g via ORAL
  Filled 2015-09-19: qty 10

## 2015-09-19 MED ORDER — SUCRALFATE 1 GM/10ML PO SUSP: 1.0000 g | Freq: Three times a day (TID) | ORAL | Status: DC

## 2015-09-19 MED ORDER — PHENOL 1.4 % MT LIQD: 1.0000 | OROMUCOSAL | Status: DC | PRN

## 2015-09-19 NOTE — Discharge Summary (Signed)
Physician Discharge Summary  Mary Lowe BJY:[id number] DOB: [birth year] DOA: 09/16/2015  PCP: PROVIDER NOT IN SYSTEM  Admit date: 09/16/2015 Discharge date: 09/19/2015  Time spent:  minutes  Recommendations for Outpatient Follow-up:  1. Follow up with [facility] OB/gyn clinic for management of ongoing nausea/vomiting abdominal pain. 2. Advance diet slowly as tolerated  Discharge Diagnoses:  Active Problems:   Viral syndrome   Hyperemesis gravidarum   Hypokalemia   Leukocytosis   Discharge Condition: stable Diet recommendation: full liquid/soft/bland. Advance as tolerated  Filed Weights   09/16/15 1952  Weight: 69.4 kg (153 lb)    History of present illness:  34 year old female who  has a past medical history of IBS (irritable bowel syndrome) and Crohn disease. today comes to hospital on 09/17/15 with complaints of vomiting which started the day prior. Patient said that the vomiting was intractable. She could not keep anything down also complained of burning of the chest and sore throat from constant vomiting. Patient  [redacted] weeks pregnant G4 P3, and developed morning sickness within 2 weeks of getting pregnant in June. Patient had been intermittently having nausea and vomiting. She denied shortness of breath, admited to having coughing up green-colored phlegm. She smokes cigarettes. Denied fever, no dysuria urgency frequency of urination. UA is clear. Rapid strep  negative. Patient also found to have leukocytosis of 31,000 along with hypokalemia potassium 3.0. OB/GYN was consulted by the ED physician and they recommended patient to be treated at [facility] hospital.  Hospital Course:  Hyperemesis gravidarum/abdominal pain Admitted and provided with IV phenergan and clear liquid diet and protonix with gavison tablets. Evaluated by Ob/Gyn who opined esophagitis and concerned for GB disease. Lipase within normal limits and US unremarkable.  On day of discharge tolerating full liquid diet.  Will discharge with phenergan, carafate. Recommend follow up with [facility] OB/gyn 1-weeks. Has been advised to advance diet slowly to eat small frequent meals.   Hypokalemia Related to #1. Resolved at discharge   Sore throat Group A rapid strep is negative. Afebrile and non-toxic appearing. See #1.  Leukocytosis Etiology unclear. May be reactive. Resolved at discharge. Remained afebrile and non-toxic appearing. Urinalysis unremarkable. Chest xray unremarkable.   Procedures:  none  Consultations:  Dr Emelda Fear OB/gyn  Discharge Exam: Filed Vitals:   09/19/15 0514  BP: 95/51  Pulse:   Temp: 98.5 F (36.9 C)  Resp: 16    General: well nourished lying in bed in dark.  Cardiovascular: RRR no MGR no LE edema Respiratory: normal effort BS clear bilaterally Abdomen: non-distended only mild tenderness to palation  Discharge Instructions    Current Discharge Medication List    START taking these medications   Details  phenol (CHLORASEPTIC) 1.4 % LIQD Use as directed 1 spray in the mouth or throat as needed for throat irritation / pain.      CONTINUE these medications which have NOT CHANGED   Details  acetaminophen (TYLENOL) 500 MG tablet Take 500 mg by mouth every 6 (six) hours as needed for moderate pain.    Prenatal Multivit-Min-Fe-FA (PRENATAL VITAMINS) 0.8 MG tablet Take 1 tablet by mouth daily. Qty: 30 tablet, Refills: 0    buPROPion (WELLBUTRIN XL) 150 MG 24 hr tablet Take 1 tablet (150 mg total) by mouth daily. Qty: 30 tablet, Refills: 11    promethazine (PHENERGAN) 25 MG tablet TAKE 0.5-1 TABLETS (12.5-25 MG TOTAL) BY MOUTH EVERY 6 (SIX) HOURS AS NEEDED FOR NAUSEA OR VOMITING. Qty: 30 tablet, Refills: 0  Allergies  Allergen Reactions  . Naproxen Nausea And Vomiting  . Sulfa Antibiotics Hives   Follow-up Information    Follow up with [facility].   Contact information:   [address] [place]  [address] [phone]       The results of significant diagnostics from this hospitalization (including imaging, microbiology, ancillary and laboratory) are listed below for reference.    Significant Diagnostic Studies: US Ob Limited  09/18/2015   CLINICAL DATA:  Right upper quadrant pain.  EXAM: LIMITED OBSTETRIC ULTRASOUND  FINDINGS: Number of Fetuses: 1  Heart Rate:  155 bpm  Movement: Yes  Presentation: Breech  Placental Location: Fundal  Previa: No  Amniotic Fluid (Subjective):  Within normal limits.  BPD:  3.8cm 17w  3d  MATERNAL FINDINGS:  Cervix:  Appears closed.  Uterus/Adnexae:  No abnormality visualized.  IMPRESSION: Single living intrauterine fetus in breech presentation. No acute maternal findings visualized.  This exam is performed on an emergent basis and does not comprehensively evaluate fetal size, dating, or anatomy; follow-up complete OB US should be considered if further fetal assessment is warranted.   Electronically Signed   By: Myles Rosenthal M.D.   On: 09/18/2015 11:44   Dg Chest Port 1 View  09/16/2015   CLINICAL DATA:  Vomiting and chest pain. Elevated white count. Pregnancy, trimester uncertain.  EXAM: PORTABLE CHEST 1 VIEW  COMPARISON:  11/19/2013  FINDINGS: Normal heart size and mediastinal contours.  Coarsening of basilar lung markings, slightly greater on the right, favored secondary to technique. No definitive pneumonia. No effusion or air leak.  IMPRESSION: No definitive pneumonia. If ongoing concern, would obtain short follow-up to re-evaluate the right base.   Electronically Signed   By: Marnee Spring M.D.   On: 09/16/2015 23:04   US Abdomen Limited Ruq  09/18/2015   CLINICAL DATA:  Right upper quadrant pain.  EXAM: US ABDOMEN LIMITED - RIGHT UPPER QUADRANT  COMPARISON:  7/23/ 12  FINDINGS: Gallbladder:  No gallstones or wall thickening visualized. No sonographic Murphy sign noted.  Common bile duct:  Diameter: 3 mm.  Liver:  There is an echogenic structure within  the right hepatic lobe measuring 2.5 x 3.7 x 2.6 cm. Present on study from 07/14/2011. This is compatible with a benign lesions such as a hemangioma. No suspicious liver abnormalities noted. Normal parenchymal echogenicity.  IMPRESSION: 1. No acute findings.  No explanation for right upper quadrant pain.   Electronically Signed   By: Signa Kell M.D.   On: 09/18/2015 11:42    Microbiology: Recent Results (from the past 240 hour(s))  Rapid strep screen     Status: None   Collection Time: 09/16/15  9:19 PM  Result Value Ref Range Status   Streptococcus, Group A Screen (Direct) NEGATIVE NEGATIVE Final    Comment: (NOTE) A Rapid Antigen test may result negative if the antigen level in the sample is below the detection level of this test. The FDA has not cleared this test as a stand-alone test therefore the rapid antigen negative result has reflexed to a Group A Strep culture.      Labs: Basic Metabolic Panel:  Recent Labs Lab 09/16/15 2040 09/17/15 0619 09/18/15 0448 09/19/15 0521  NA 140 140 137 140  K 3.0* 3.1* 3.4* 3.8  CL 102 113* 111 112*  CO2 21*  GLUCOSE 155* 126* 101* 86  BUN 12 8 5* <5*  CREATININE 0.71 0.42* 0.39* 0.40*  CALCIUM  9.0 7.5* 7.5* 8.1*  MG  --  1.6* 1.8  --    Liver Function Tests:  Recent Labs Lab 09/17/15 0619  AST 17  ALT 11*  ALKPHOS 34*  BILITOT 0.5  PROT 5.3*  ALBUMIN 2.7*    Recent Labs Lab 09/18/15 0448  LIPASE 24  AMYLASE 39   No results for input(s): AMMONIA in the last 168 hours. CBC:  Recent Labs Lab 09/16/15 2040 09/17/15 0619 09/18/15 0448 09/19/15 0521  WBC 31.2* 23.8* 12.8* 10.2  NEUTROABS 28.8*  --   --   --   HGB 14.4 10.9* 9.7* 10.2*  HCT 41.5 32.1* 28.5* 30.5*  MCV 88.1 88.9 89.9 90.0  PLT 275 212 186 174   Cardiac Enzymes: No results for input(s): CKTOTAL, CKMB, CKMBINDEX, TROPONINI in the last 168 hours. BNP: BNP (last 3 results) No results for input(s): BNP in the last 8760 hours.  ProBNP  (last 3 results) No results for input(s): PROBNP in the last 8760 hours.  CBG: No results for input(s): GLUCAP in the last 168 hours.     SignedGwenyth Bender  [facility] 09/19/2015, 10:17 AM

## 2015-09-19 NOTE — Care Management Note (Signed)
Case Management Note  Patient Details  Name: NIXIE LAUBE MRN: [id number] Date of Birth: [birth year]  Subjective/Objective:                    Action/Plan:   Expected Discharge Date:                  Expected Discharge Plan:  Home/Self Care  In-House Referral:  NA  Discharge planning Services  CM Consult  Post Acute Care Choice:  NA Choice offered to:  NA  DME Arranged:    DME Agency:     HH Arranged:    HH Agency:     Status of Service:  Completed, signed off  Medicare Important Message Given:    Date Medicare IM Given:    Medicare IM give by:    Date Additional Medicare IM Given:    Additional Medicare Important Message give by:     If discussed at Long Length of Stay Meetings, dates discussed:    Additional Comments: Pt discharged home today. No CM needs noted. Arlyss Queen [place], RN 09/19/2015, 1:15 PM

## 2015-10-01 ENCOUNTER — Other Ambulatory Visit: Payer: Self-pay | Admitting: Women's Health

## 2015-10-02 ENCOUNTER — Telehealth: Payer: Self-pay | Admitting: *Deleted

## 2015-10-02 NOTE — Telephone Encounter (Signed)
Spoke with pt letting her know we recommend Robitussin, and cough drops. Pt states she has tried those. If PCP wants her to have rx, Kim advised [employer]. Pt voiced understanding. JSY

## 2015-10-02 NOTE — Telephone Encounter (Signed)
Left message x 1. JSY 

## 2015-10-04 ENCOUNTER — Encounter: Payer: Self-pay | Admitting: Advanced Practice Midwife

## 2015-10-04 ENCOUNTER — Ambulatory Visit (INDEPENDENT_AMBULATORY_CARE_PROVIDER_SITE_OTHER): Payer: Medicaid Other | Admitting: Advanced Practice Midwife

## 2015-10-04 ENCOUNTER — Ambulatory Visit (INDEPENDENT_AMBULATORY_CARE_PROVIDER_SITE_OTHER): Payer: Medicaid Other

## 2015-10-04 VITALS — BP 120/70 | HR 96 | Wt 154.0 lb

## 2015-10-04 DIAGNOSIS — Z3492 Encounter for supervision of normal pregnancy, unspecified, second trimester: Secondary | ICD-10-CM

## 2015-10-04 DIAGNOSIS — Z331 Pregnant state, incidental: Secondary | ICD-10-CM

## 2015-10-04 DIAGNOSIS — Z363 Encounter for antenatal screening for malformations: Secondary | ICD-10-CM

## 2015-10-04 DIAGNOSIS — Z36 Encounter for antenatal screening of mother: Secondary | ICD-10-CM

## 2015-10-04 DIAGNOSIS — Z62810 Personal history of physical and sexual abuse in childhood: Secondary | ICD-10-CM

## 2015-10-04 DIAGNOSIS — Z1389 Encounter for screening for other disorder: Secondary | ICD-10-CM

## 2015-10-04 LAB — POCT URINALYSIS DIPSTICK
Blood, UA: NEGATIVE
GLUCOSE UA: NEGATIVE
Ketones, UA: NEGATIVE
LEUKOCYTES UA: NEGATIVE
Nitrite, UA: NEGATIVE
PROTEIN UA: NEGATIVE

## 2015-10-04 NOTE — Progress Notes (Signed)
Pt denies any problems or concerns at this time.  

## 2015-10-04 NOTE — Progress Notes (Addendum)
[place] 19+6 wks, measurements c/w dates 329 g,cephalic,ant pl g 0,normal ov's bilat,svp of fluid 5.45 cm,fhr 143 bpm,cx 4.6 cm,anatomy complete,no obvious abn seen

## 2015-10-04 NOTE — Patient Instructions (Signed)

## 2015-10-04 NOTE — Progress Notes (Signed)
[id number] [redacted]w[redacted]d Estimated Date of Delivery: 02/23/16  Blood pressure 120/70, pulse 96, weight 154 lb (69.854 kg), last menstrual period 05/10/2015.   BP weight and urine results all reviewed and noted.  Please refer to the obstetrical flow sheet for the fundal height and fetal heart rate documentation:US 19+6 wks, measurements c/w dates 329 g,cephalic,ant pl g 0,normal ov's bilat,svp of fluid 5.45 cm,fhr 143 bpm,anatomy complete,no obvious abn seen  Patient reports good fetal movement, denies any bleeding and no rupture of membranes symptoms or regular contractions. Patient is without complaints. All questions were answered.  Orders Placed This Encounter  Procedures  . POCT urinalysis dipstick    Plan:  Continued routine obstetrical care,   Return in about 4 weeks (around 11/01/2015) for LROB.

## 2015-10-15 ENCOUNTER — Other Ambulatory Visit: Payer: Self-pay | Admitting: Women's Health

## 2015-10-18 ENCOUNTER — Telehealth: Payer: Self-pay | Admitting: Obstetrics and Gynecology

## 2015-10-18 NOTE — Telephone Encounter (Signed)
Please advise OTC miralax per bottle directions

## 2015-10-18 NOTE — Telephone Encounter (Signed)
Pt states that she has not had a BM in 5 days. Pt states that she has tried miralax with no relief, and tried colace and had no relief either. Pt states is there something that can be called in for her that her medicaid will cover that will help with her constipation.

## 2015-10-20 NOTE — Telephone Encounter (Signed)
appt to discuss Amitiza

## 2015-10-29 NOTE — Telephone Encounter (Signed)
Pt never returned call

## 2015-11-01 ENCOUNTER — Encounter: Payer: Self-pay | Admitting: Obstetrics and Gynecology

## 2015-11-01 ENCOUNTER — Ambulatory Visit (INDEPENDENT_AMBULATORY_CARE_PROVIDER_SITE_OTHER): Payer: Medicaid Other | Admitting: Obstetrics and Gynecology

## 2015-11-01 VITALS — BP 120/72 | HR 89 | Wt 156.5 lb

## 2015-11-01 DIAGNOSIS — Z3492 Encounter for supervision of normal pregnancy, unspecified, second trimester: Secondary | ICD-10-CM

## 2015-11-01 DIAGNOSIS — Z1389 Encounter for screening for other disorder: Secondary | ICD-10-CM

## 2015-11-01 DIAGNOSIS — Z331 Pregnant state, incidental: Secondary | ICD-10-CM

## 2015-11-01 LAB — POCT URINALYSIS DIPSTICK
Blood, UA: NEGATIVE
Glucose, UA: NEGATIVE
Ketones, UA: NEGATIVE
LEUKOCYTES UA: NEGATIVE
Nitrite, UA: NEGATIVE

## 2015-11-01 NOTE — Progress Notes (Signed)
Pt denies any problems or concerns at this time.  

## 2015-11-01 NOTE — Progress Notes (Signed)
Patient ID: Mary Lowe, female   DOB: 1981-08-06, 34 y.o.   MRN: [id number]  [id number] [redacted]w[redacted]d Estimated Date of Delivery: 02/23/16  Blood pressure 120/72, pulse 89, weight 156 lb 8 oz (70.988 kg), last menstrual period 05/10/2015.   refer to the ob flow sheet for FH and FHR, also BP, Wt, Urine results: trace protein  Patient reports  + good fetal movement, denies any bleeding and no rupture of membranes symptoms or regular contractions. Patient complaints: She reports she has a small umbilical hernia that is tender to touch.  She is currently taking prenatal vitamins. Pt is unsure at this time what she wants to use for birth control post partum, she would like to eventually get her tubes tied but states her husband wants more children.   FHR: 130 FH: 22cm Edema: none  Questions were answered. Assessment: [redacted]w[redacted]d [id number] Small umbilical hernia  Plan:  Continued routine obstetrical care, F/u in 4 weeks   By signing my name below, I, Jarvis Morgan, attest that this documentation has been prepared under the direction and in the presence of Tilda Burrow, MD. Electronically Signed: Jarvis Morgan, ED Scribe. 11/01/2015. 9:54 AM.  I personally performed the services described in this documentation, which was SCRIBED in my presence. The recorded information has been reviewed and considered accurate. It has been edited as necessary during review. Tilda Burrow, MD

## 2015-11-19 ENCOUNTER — Telehealth: Payer: Self-pay | Admitting: Obstetrics and Gynecology

## 2015-11-19 NOTE — Telephone Encounter (Signed)
Pt states that she is constipated again for about a week. Pt states that she has had some rectal bleeding after BM. Pt states that she is nauseated as well even after she takes the phenergan. Pt states that she has tried miralax and has some results but she has rectal bleeding. Pt states that she has a hernia that is bothering her as well. Pt states that she pushes the hernia back in but it burns and hurts bad at times.

## 2015-11-19 NOTE — Telephone Encounter (Signed)
I spoke with Dr. Emelda Fear and he advised that the pt should take the miralax daily to get good results and that the hernia popping out is normal during pregnancy, and as long as she can push the hernia back into place then things are ok. Pt was advised of all of this and advised to call our office right away if the hernia became really hard or she was unable to push the hernia back in. Pt verbalized understanding.

## 2015-11-22 DIAGNOSIS — Z8719 Personal history of other diseases of the digestive system: Secondary | ICD-10-CM

## 2015-11-22 DIAGNOSIS — Z9889 Other specified postprocedural states: Secondary | ICD-10-CM

## 2015-11-22 HISTORY — DX: Other specified postprocedural states: Z98.890

## 2015-11-22 HISTORY — DX: Personal history of other diseases of the digestive system: Z87.19

## 2015-11-28 ENCOUNTER — Other Ambulatory Visit: Payer: Self-pay | Admitting: Obstetrics and Gynecology

## 2015-11-28 DIAGNOSIS — O99613 Diseases of the digestive system complicating pregnancy, third trimester: Secondary | ICD-10-CM

## 2015-11-28 DIAGNOSIS — O09522 Supervision of elderly multigravida, second trimester: Secondary | ICD-10-CM

## 2015-11-28 DIAGNOSIS — K5090 Crohn's disease, unspecified, without complications: Secondary | ICD-10-CM

## 2015-11-29 ENCOUNTER — Ambulatory Visit (INDEPENDENT_AMBULATORY_CARE_PROVIDER_SITE_OTHER): Payer: Medicaid Other

## 2015-11-29 ENCOUNTER — Encounter: Payer: Self-pay | Admitting: Obstetrics and Gynecology

## 2015-11-29 ENCOUNTER — Ambulatory Visit (INDEPENDENT_AMBULATORY_CARE_PROVIDER_SITE_OTHER): Payer: Medicaid Other | Admitting: Obstetrics and Gynecology

## 2015-11-29 VITALS — BP 98/60 | HR 92 | Wt 162.0 lb

## 2015-11-29 DIAGNOSIS — O210 Mild hyperemesis gravidarum: Secondary | ICD-10-CM

## 2015-11-29 DIAGNOSIS — Z3A28 28 weeks gestation of pregnancy: Secondary | ICD-10-CM

## 2015-11-29 DIAGNOSIS — Z1389 Encounter for screening for other disorder: Secondary | ICD-10-CM

## 2015-11-29 DIAGNOSIS — Z09 Encounter for follow-up examination after completed treatment for conditions other than malignant neoplasm: Secondary | ICD-10-CM | POA: Insufficient documentation

## 2015-11-29 DIAGNOSIS — O09522 Supervision of elderly multigravida, second trimester: Secondary | ICD-10-CM

## 2015-11-29 DIAGNOSIS — Z331 Pregnant state, incidental: Secondary | ICD-10-CM

## 2015-11-29 DIAGNOSIS — O99613 Diseases of the digestive system complicating pregnancy, third trimester: Secondary | ICD-10-CM

## 2015-11-29 DIAGNOSIS — K5090 Crohn's disease, unspecified, without complications: Secondary | ICD-10-CM

## 2015-11-29 DIAGNOSIS — Z3493 Encounter for supervision of normal pregnancy, unspecified, third trimester: Secondary | ICD-10-CM

## 2015-11-29 DIAGNOSIS — Z62810 Personal history of physical and sexual abuse in childhood: Secondary | ICD-10-CM

## 2015-11-29 DIAGNOSIS — K429 Umbilical hernia without obstruction or gangrene: Secondary | ICD-10-CM

## 2015-11-29 LAB — POCT URINALYSIS DIPSTICK
GLUCOSE UA: NEGATIVE
Ketones, UA: NEGATIVE
NITRITE UA: POSITIVE
Protein, UA: NEGATIVE
RBC UA: NEGATIVE

## 2015-11-29 MED ORDER — METOCLOPRAMIDE HCL 10 MG PO TABS: 10.0000 mg | ORAL_TABLET | Freq: Three times a day (TID) | ORAL | Status: DC

## 2015-11-29 NOTE — Progress Notes (Signed)
Patient ID: ONEDIA VARGUS, female   DOB: 27-Dec-1980, 34 y.o.   MRN: [id number]  [id number] [redacted]w[redacted]d Estimated Date of Delivery: 02/23/16  Blood pressure 98/60, pulse 92, weight 162 lb (73.483 kg), last menstrual period 05/10/2015.   refer to the ob flow sheet for FH and FHR, also BP, Wt, Urine results:notable for positive nitrites and trace leukocytes  Patient reports  + good fetal movement, denies any bleeding and no rupture of membranes symptoms or regular contractions. Patient complaints: Pt states she is really nauseated and vomiting with constipation. She states she has been able to void at this time. Pt was taking Zofran which provided no relief. She wants to have a tubal ligation post partum and agreed to wait 1 month  FHR:150 FH: 28cm Edema: none  Questions were answered. Assessment: LROB [id number] @ [redacted]w[redacted]d  Small umbilical hernia for PP repair Nausea and vomiting in pregnancy,  Rx Reglan Constipation Desire for sterilization 4 wk pp  Plan:  Continued routine obstetrical care, F/u in 4 weeks    By signing my name below, I, Jarvis Morgan, attest that this documentation has been prepared under the direction and in the presence of Tilda Burrow, MD. Electronically Signed: Jarvis Morgan, ED Scribe. 11/29/2015. 9:43 AM.  I personally performed the services described in this documentation, which was SCRIBED in my presence. The recorded information has been reviewed and considered accurate. It has been edited as necessary during review. Tilda Burrow, MD

## 2015-11-29 NOTE — Progress Notes (Signed)
[place] 27+5 wks,measurement c/w dates,cephalic,cx 3.3cm,ant pl gr 1,svp of fluid 4.3cm,normal ov's bilat,fhr 138bpm,efw 1251g 65%

## 2015-11-29 NOTE — Progress Notes (Signed)
Pt states that she is still really nauseated and vomiting and with constipation. Pt unable to void at this time. Pt needs to have sugar test.

## 2015-12-05 ENCOUNTER — Telehealth: Payer: Self-pay | Admitting: *Deleted

## 2015-12-05 NOTE — Telephone Encounter (Signed)
Pt c/o burning with urination, fever 101 at times, "feels like a UTI." Pt informed to take tylenol for fever, push lots of water, appt scheduled for tomorrow. If no improvement before she can be seen tomorrow needs to go to [facility] for evaluation. Pt verbalized understanding.

## 2015-12-06 ENCOUNTER — Other Ambulatory Visit: Payer: Medicaid Other

## 2015-12-06 ENCOUNTER — Encounter: Payer: Self-pay | Admitting: Adult Health

## 2015-12-06 DIAGNOSIS — Z369 Encounter for antenatal screening, unspecified: Secondary | ICD-10-CM

## 2015-12-07 LAB — CBC
HEMOGLOBIN: 11.2 g/dL (ref 11.1–15.9)
Hematocrit: 33.9 % — ABNORMAL LOW (ref 34.0–46.6)
MCH: 30.8 pg (ref 26.6–33.0)
MCHC: 33 g/dL (ref 31.5–35.7)
MCV: 93 fL (ref 79–97)
NRBC: 0 % (ref 0–0)
Platelets: 237 10*3/uL (ref 150–379)
RBC: 3.64 x10E6/uL — AB (ref 3.77–5.28)
RDW: 13.6 % (ref 12.3–15.4)
WBC: 16 10*3/uL — ABNORMAL HIGH (ref 3.4–10.8)

## 2015-12-07 LAB — ANTIBODY SCREEN: ANTIBODY SCREEN: NEGATIVE

## 2015-12-07 LAB — GLUCOSE TOLERANCE, 2 HOURS W/ 1HR
GLUCOSE, 1 HOUR: 148 mg/dL (ref 65–179)
GLUCOSE, 2 HOUR: 94 mg/dL (ref 65–152)
Glucose, Fasting: 76 mg/dL (ref 65–91)

## 2015-12-07 LAB — HIV ANTIBODY (ROUTINE TESTING W REFLEX): HIV Screen 4th Generation wRfx: NONREACTIVE

## 2015-12-07 LAB — RPR: RPR Ser Ql: NONREACTIVE

## 2015-12-10 ENCOUNTER — Emergency Department (HOSPITAL_COMMUNITY): Payer: Medicaid Other

## 2015-12-10 ENCOUNTER — Ambulatory Visit (HOSPITAL_COMMUNITY)
Admit: 2015-12-10 | Discharge: 2015-12-10 | Disposition: A | Discharge status: Home / Self Care | Payer: Medicaid Other | Attending: General Surgery | Admitting: General Surgery

## 2015-12-10 ENCOUNTER — Telehealth: Payer: Self-pay | Admitting: *Deleted

## 2015-12-10 ENCOUNTER — Encounter (HOSPITAL_COMMUNITY): Payer: Self-pay | Admitting: *Deleted

## 2015-12-10 ENCOUNTER — Inpatient Hospital Stay (HOSPITAL_COMMUNITY)
Admission: EM | Admit: 2015-12-10 | Discharge: 2015-12-10 | Disposition: A | Discharge status: Home / Self Care | Payer: Medicaid Other | Attending: Obstetrics and Gynecology | Admitting: Obstetrics and Gynecology

## 2015-12-10 DIAGNOSIS — O26893 Other specified pregnancy related conditions, third trimester: Secondary | ICD-10-CM | POA: Diagnosis not present

## 2015-12-10 DIAGNOSIS — Z3A29 29 weeks gestation of pregnancy: Secondary | ICD-10-CM | POA: Diagnosis not present

## 2015-12-10 DIAGNOSIS — Z87891 Personal history of nicotine dependence: Secondary | ICD-10-CM | POA: Diagnosis not present

## 2015-12-10 DIAGNOSIS — K921 Melena: Secondary | ICD-10-CM | POA: Diagnosis not present

## 2015-12-10 DIAGNOSIS — K589 Irritable bowel syndrome without diarrhea: Secondary | ICD-10-CM | POA: Insufficient documentation

## 2015-12-10 DIAGNOSIS — K429 Umbilical hernia without obstruction or gangrene: Secondary | ICD-10-CM | POA: Diagnosis not present

## 2015-12-10 DIAGNOSIS — K5090 Crohn's disease, unspecified, without complications: Secondary | ICD-10-CM | POA: Diagnosis not present

## 2015-12-10 DIAGNOSIS — R112 Nausea with vomiting, unspecified: Secondary | ICD-10-CM | POA: Insufficient documentation

## 2015-12-10 DIAGNOSIS — K421 Umbilical hernia with gangrene: Secondary | ICD-10-CM

## 2015-12-10 DIAGNOSIS — Z79899 Other long term (current) drug therapy: Secondary | ICD-10-CM | POA: Diagnosis not present

## 2015-12-10 LAB — CBC
HCT: 33.3 % — ABNORMAL LOW (ref 36.0–46.0)
Hemoglobin: 11 g/dL — ABNORMAL LOW (ref 12.0–15.0)
MCH: 30.3 pg (ref 26.0–34.0)
MCHC: 33 g/dL (ref 30.0–36.0)
MCV: 91.7 fL (ref 78.0–100.0)
PLATELETS: 220 10*3/uL (ref 150–400)
RBC: 3.63 MIL/uL — ABNORMAL LOW (ref 3.87–5.11)
RDW: 13.5 % (ref 11.5–15.5)
WBC: 18.3 10*3/uL — AB (ref 4.0–10.5)

## 2015-12-10 LAB — BASIC METABOLIC PANEL
ANION GAP: 9 (ref 5–15)
BUN: 6 mg/dL (ref 6–20)
CALCIUM: 8.4 mg/dL — AB (ref 8.9–10.3)
CO2: 21 mmol/L — AB (ref 22–32)
CREATININE: 0.46 mg/dL (ref 0.44–1.00)
Chloride: 109 mmol/L (ref 101–111)
GFR calc Af Amer: >60 mL/min (ref >60)
GLUCOSE: 81 mg/dL (ref 65–99)
Potassium: 3.8 mmol/L (ref 3.5–5.1)
Sodium: 139 mmol/L (ref 135–145)

## 2015-12-10 LAB — URINALYSIS, ROUTINE W REFLEX MICROSCOPIC
Bilirubin Urine: NEGATIVE
Glucose, UA: NEGATIVE mg/dL
HGB URINE DIPSTICK: NEGATIVE
Ketones, ur: NEGATIVE mg/dL
LEUKOCYTES UA: NEGATIVE
NITRITE: POSITIVE — AB
PROTEIN: NEGATIVE mg/dL
Specific Gravity, Urine: 1.015 (ref 1.005–1.030)
pH: 6 (ref 5.0–8.0)

## 2015-12-10 LAB — URINE MICROSCOPIC-ADD ON: RBC / HPF: NONE SEEN RBC/hpf (ref 0–5)

## 2015-12-10 LAB — RAPID URINE DRUG SCREEN, HOSP PERFORMED
Amphetamines: NOT DETECTED
BARBITURATES: POSITIVE — AB
BENZODIAZEPINES: NOT DETECTED
Cocaine: NOT DETECTED
Opiates: POSITIVE — AB
Tetrahydrocannabinol: NOT DETECTED

## 2015-12-10 LAB — WET PREP, GENITAL
Sperm: NONE SEEN
TRICH WET PREP: NONE SEEN
YEAST WET PREP: NONE SEEN

## 2015-12-10 MED ORDER — NALBUPHINE HCL 10 MG/ML IJ SOLN
10.0000 mg | INTRAMUSCULAR | Status: DC | PRN
  Administered 2015-12-10: 10 mg via INTRAMUSCULAR
  Filled 2015-12-10: qty 1

## 2015-12-10 MED ORDER — GI COCKTAIL ~~LOC~~
30.0000 mL | Freq: Once | ORAL | Status: AC
  Administered 2015-12-10: 30 mL via ORAL
  Filled 2015-12-10: qty 30

## 2015-12-10 MED ORDER — SODIUM CHLORIDE 0.9 % IV BOLUS (SEPSIS)
1000.0000 mL | Freq: Once | INTRAVENOUS | Status: AC
  Administered 2015-12-10: 1000 mL via INTRAVENOUS

## 2015-12-10 MED ORDER — ACETAMINOPHEN 500 MG PO TABS
1000.0000 mg | ORAL_TABLET | Freq: Once | ORAL | Status: AC
  Administered 2015-12-10: 1000 mg via ORAL
  Filled 2015-12-10: qty 2

## 2015-12-10 MED ORDER — PROMETHAZINE HCL 25 MG/ML IJ SOLN
12.5000 mg | Freq: Once | INTRAMUSCULAR | Status: AC
  Administered 2015-12-10: 12.5 mg via INTRAVENOUS
  Filled 2015-12-10: qty 1

## 2015-12-10 MED ORDER — FAMOTIDINE IN NACL 20-0.9 MG/50ML-% IV SOLN
20.0000 mg | INTRAVENOUS | Status: AC
  Administered 2015-12-10: 20 mg via INTRAVENOUS
  Filled 2015-12-10: qty 50

## 2015-12-10 MED ORDER — HYDROMORPHONE HCL 1 MG/ML IJ SOLN
1.0000 mg | INTRAMUSCULAR | Status: AC | PRN
  Administered 2015-12-10: 1 mg via INTRAVENOUS
  Filled 2015-12-10: qty 1

## 2015-12-10 MED ORDER — HYDROMORPHONE HCL 1 MG/ML IJ SOLN
1.0000 mg | Freq: Once | INTRAMUSCULAR | Status: AC
  Administered 2015-12-10: 1 mg via INTRAMUSCULAR
  Filled 2015-12-10: qty 1

## 2015-12-10 MED ORDER — CEPHALEXIN 500 MG PO CAPS
500.0000 mg | ORAL_CAPSULE | Freq: Once | ORAL | Status: AC
Start: 2015-12-10 — End: 2015-12-10
  Administered 2015-12-10: 500 mg via ORAL
  Filled 2015-12-10: qty 1

## 2015-12-10 MED ORDER — OXYCODONE-ACETAMINOPHEN 5-325 MG PO TABS: 1.0000 | ORAL_TABLET | Freq: Four times a day (QID) | ORAL | Status: DC | PRN

## 2015-12-10 NOTE — ED Notes (Signed)
Waiting for pharmacy to re-stock GI cocktail in Pyxis. Pt states she took Tylenol less than three hours ago. Will wait to give Tylenol per pt request.

## 2015-12-10 NOTE — ED Notes (Signed)
MD Hyacinth Meeker at bedside with this RN present for pelvic exam.

## 2015-12-10 NOTE — ED Provider Notes (Signed)
CSN: [id number]     Arrival date & time 12/10/15  0715 History   First MD Initiated Contact with Patient 12/10/15 [phone]     Chief Complaint  Patient presents with  . Abdominal Pain     (Consider location/radiation/quality/duration/timing/severity/associated sxs/prior Treatment) HPI Comments: The patient is a 34 year old female, she has a history of Crohn's disease and irritable bowel syndrome as well as a history of ectopic pregnancy which did require surgery. She is pregnant, 29 weeks, followed [facility]. Her due date is March 4. She reports that she has 4 living children, she has lost 3 other pregnancies. She reports that she has been following with Dr. Emelda Fear regularly, she has had pain over her umbilicus that she described as a burning pain which has been present with this pregnancy, gradually worsening and is now constant and severe. This has been present constantly for the last 3 days, it does not seem to be going away, she has been having associated nausea and vomiting which is also been intermittent throughout the pregnancy but more prominent the last 24 hours. She has not had any diarrhea but has had a small amount of blood in her stool with her last stool, she describes no dysuria or urinary frequency but she does endorse having vaginal leakage of fluids. She states it is like water, it has been like that for 2 weeks, she has noticed it this morning as well. There is no blood from the vagina, she states that she is feeling the baby move as per normal  Patient is a 34 y.o. female presenting with abdominal pain. The history is provided by the patient.  Abdominal Pain   Past Medical History  Diagnosis Date  . IBS (irritable bowel syndrome)   . Crohn disease [facility])    Past Surgical History  Procedure Laterality Date  . Tonsillectomy    . Ectopic pregnancy surgery     Family History  Problem Relation Age of Onset  . Depression Mother   . Depression Father    . Heart disease Father   . Alcohol abuse Father   . Hypertension Father   . Anesthesia problems Neg Hx   . Hypotension Neg Hx   . Malignant hyperthermia Neg Hx   . Pseudochol deficiency Neg Hx   . Alcohol abuse Paternal Uncle   . Diabetes Maternal Grandfather   . Alcohol abuse Paternal Grandmother   . Alcohol abuse Paternal Grandfather   . Cancer Paternal Grandfather     lung  . Cancer Maternal Grandmother    Social History  Substance Use Topics  . Smoking status: Former Smoker -- 0.50 packs/day    Types: Cigarettes  . Smokeless tobacco: Never Used  . Alcohol Use: No   OB History    Gravida Para Term Preterm AB TAB SAB Ectopic Multiple Living   7 3 3  0 3 0 2 1 0 3     Review of Systems  Gastrointestinal: Positive for abdominal pain.  All other systems reviewed and are negative.     Allergies  Naproxen and Sulfa antibiotics  Home Medications   Prior to Admission medications   Medication Sig Start Date End Date Taking? Authorizing Provider  acetaminophen (TYLENOL) 500 MG tablet Take 500 mg by mouth every 6 (six) hours as needed for moderate pain.   Yes Historical Provider, MD  metoCLOPramide (REGLAN) 10 MG tablet Take 1 tablet (10 mg total) by mouth 3 (three) times daily with meals. 11/29/15 11/28/16  Yes Tilda Burrow, MD  Prenatal Multivit-Min-Fe-FA (PRENATAL VITAMINS) 0.8 MG tablet Take 1 tablet by mouth daily. 06/18/15  Yes Dione Booze, MD  PROAIR HFA 108 (90 BASE) MCG/ACT inhaler Inhale 2 puffs into the lungs daily as needed. 10/16/15  Yes Historical Provider, MD  promethazine (PHENERGAN) 25 MG tablet TAKE 0.5-1 TABLETS (12.5-25 MG TOTAL) BY MOUTH EVERY 6 (SIX) HOURS AS NEEDED FOR NAUSEA OR VOMITING. 10/15/15  Yes Cheral Marker, CNM   BP 105/74 mmHg  Pulse 85  Temp(Src) 98.1 F (36.7 C) (Oral)  Resp 16  Ht  (1.575 m)  Wt 168 lb (76.204 kg)  BMI 30.72 kg/m2  SpO2 99%  LMP 05/10/2015 Physical Exam  Constitutional: She appears well-developed and  well-nourished. No distress.  HENT:  Head: Normocephalic and atraumatic.  Mouth/Throat: Oropharynx is clear and moist. No oropharyngeal exudate.  Eyes: Conjunctivae and EOM are normal. Pupils are equal, round, and reactive to light. Right eye exhibits no discharge. Left eye exhibits no discharge. No scleral icterus.  Neck: Normal range of motion. Neck supple. No JVD present. No thyromegaly present.  Cardiovascular: Regular rhythm, normal heart sounds and intact distal pulses.  Exam reveals no gallop and no friction rub.   No murmur heard. HR 103, normal pulses, no edema  Pulmonary/Chest: Effort normal and breath sounds normal. No respiratory distress. She has no wheezes. She has no rales.  Abdominal: Soft. Bowel sounds are normal. She exhibits no distension and no mass. There is tenderness.  Mild diffuse ttp, has increased ttp over the umbilicus even to light touch - there is no obvious hernia present at this time - pt endorses hx of one but states it is not out at this time.  Uterus palpated above the umbilicus  Genitourinary:  Has chaperone present for exam - normal appearing external genitalia - wet prep sent - no pooling of fluid though nurse states that she saw a larger amount of clear fluid when the pt got off the bed. - puddled on the bed.  Musculoskeletal: Normal range of motion. She exhibits no edema or tenderness.  Lymphadenopathy:    She has no cervical adenopathy.  Neurological: She is alert. Coordination normal.  Skin: Skin is warm and dry. No rash noted. No erythema.  Psychiatric: She has a normal mood and affect. Her behavior is normal.  Nursing note and vitals reviewed.   ED Course  Procedures (including critical care time) Labs Review Labs Reviewed  WET PREP, GENITAL - Abnormal; Notable for the following:    Clue Cells Wet Prep HPF POC PRESENT (*)    WBC, Wet Prep HPF POC MANY (*)    All other components within normal limits  URINALYSIS, ROUTINE W REFLEX MICROSCOPIC  (NOT AT [facility]) - Abnormal; Notable for the following:    Nitrite POSITIVE (*)    All other components within normal limits  URINE MICROSCOPIC-ADD ON - Abnormal; Notable for the following:    Squamous Epithelial / LPF 6-30 (*)    Bacteria, UA MANY (*)    All other components within normal limits    Imaging Review US Abdomen Limited  12/10/2015  CLINICAL DATA:  Umbilical hernia, pain at the belly button for 2 months EXAM: LIMITED ABDOMINAL ULTRASOUND COMPARISON:  None. FINDINGS: Real-time sonography of the periumbilical region is performed. There is hyperechoic material protruding through a defect in the abdominal wall most concerning a form an umbilical hernia likely containing fat and possibly a small amounts of bowel. There is no  other fluid collection or hematoma. IMPRESSION: Hyperechoic material protruding through a defect in the abdominal wall most concerning a form an umbilical hernia likely containing fat and possibly a small amounts of bowel. Electronically Signed   By: Elige Ko   On: 12/10/2015 09:57   I have personally reviewed and evaluated these images and lab results as part of my medical decision-making.    MDM   Final diagnoses:  Umbilical hernia without obstruction and without gangrene    The pt is in no distress, VS show mild tachycardia - abd pain is diffuse and of uncertain etiology given pt's diffuse ttp.  She has no guarding, no peritoneal signs and no vaginal bleeding - will obtain fetal monitoring, d/w OB, pain in the umbilicius is likely neuropathic - burning pain that is worse with light touch and constant.  Due to increased fluid leakage - sterile exam performed - cervix is soft and appears visualy closed  Will d/w OBGYN - pt is now on fetal monitoring.  D/w Dr. Emelda Fear at 845 - he will come to see pt  UA shows lots of bacteria - will treat for bacteriuria and culture.  D/w ferguson has seen at 9 AM - states fluid is vaginal secretions - no signs of ROM,  will see tomorrow in office - [place] ordered to evaluate for hernia at Dr. Robinette Haines requests - may need closure if loop of bowel.  Dr. Emelda Fear has seen and will facilitate transfer to the MAU at [facility] for surgical consultation - for possible umbilical hernia  Eber Hong, MD 12/10/15 1125

## 2015-12-10 NOTE — ED Notes (Signed)
This note also relates to the following rows which could not be included: Pulse Rate - Cannot attach notes to unvalidated device data SpO2 - Cannot attach notes to unvalidated device data   [facility] notified of pt in due to increased pain over the last week from her umbilical hernia,  vomiting and also leaking clear fluid.

## 2015-12-10 NOTE — Discharge Instructions (Signed)
Hernia  A hernia happens when an organ or tissue inside your body pushes out through a weak spot in the belly (abdomen).  HOME CARE  · Avoid stretching or overusing (straining) the muscles near the hernia.  · Do not lift anything heavier than 10 lb (4.5 kg).  · Use the muscles in your leg when you lift something up. Do not use the muscles in your back.  · When you cough, try to cough gently.  · Eat a diet that has a lot of fiber. Eat lots of fruits and vegetables.  · Drink enough fluids to keep your pee (urine) clear or pale yellow. Try to drink 6-8 glasses of water a day.  · Take medicines to make your poop soft (stool softeners) as told by your doctor.  · Lose weight, if you are overweight.  · Do not use any tobacco products, including cigarettes, chewing tobacco, or electronic cigarettes. If you need help quitting, ask your doctor.  · Keep all follow-up visits as told by your doctor. This is important.  GET HELP IF:  · The skin by the hernia gets puffy (swollen) or red.  · The hernia is painful.  GET HELP RIGHT AWAY IF:  · You have a fever.  · You have belly pain that is getting worse.  · You feel sick to your stomach (nauseous) or you throw up (vomit).  · You cannot push the hernia back in place by gently pressing on it while you are lying down.  · The hernia:    Changes in shape or size.    Is stuck outside your belly.    Changes color.    Feels hard or tender.     This information is not intended to replace advice given to you by your health care provider. Make sure you discuss any questions you have with your health care provider.     Document Released: 05/28/2010 Document Revised: 12/29/2014 Document Reviewed: 10/18/2014  Elsevier Interactive Patient Education ©2016 [employer]

## 2015-12-10 NOTE — ED Notes (Signed)
Called [facility]  TO TRANSPORT PT TO [facility]om[facility].

## 2015-12-10 NOTE — MAU Provider Note (Signed)
History   [id number] at 29.[redacted] wks gestation sent from Dr. Robinette Haines office for incarcerated umbilical hernia. Dr. Gar Ponto has already talked with [place] [facility] regarding pt.   CSN: [id number]  Arrival date & time 12/10/15  1237   None     Chief Complaint  Patient presents with  . Abdominal Pain    HPI  Past Medical History  Diagnosis Date  . IBS (irritable bowel syndrome)   . Crohn disease [facility])     Past Surgical History  Procedure Laterality Date  . Tonsillectomy    . Ectopic pregnancy surgery      Family History  Problem Relation Age of Onset  . Depression Mother   . Depression Father   . Heart disease Father   . Alcohol abuse Father   . Hypertension Father   . Anesthesia problems Neg Hx   . Hypotension Neg Hx   . Malignant hyperthermia Neg Hx   . Pseudochol deficiency Neg Hx   . Alcohol abuse Paternal Uncle   . Diabetes Maternal Grandfather   . Alcohol abuse Paternal Grandmother   . Alcohol abuse Paternal Grandfather   . Cancer Paternal Grandfather     lung  . Cancer Maternal Grandmother     Social History  Substance Use Topics  . Smoking status: Former Smoker -- 0.50 packs/day    Types: Cigarettes  . Smokeless tobacco: Never Used  . Alcohol Use: No    OB History    Gravida Para Term Preterm AB TAB SAB Ectopic Multiple Living   7 3 3  0 3 0 2 1 0 3      Review of Systems  Constitutional: Negative.   HENT: Negative.   Eyes: Negative.   Respiratory: Negative.   Cardiovascular: Negative.   Gastrointestinal: Positive for abdominal pain.  Endocrine: Negative.   Genitourinary: Negative.   Musculoskeletal: Negative.   Skin: Negative.   Allergic/Immunologic: Negative.   Neurological: Negative.   Hematological: Negative.   Psychiatric/Behavioral: Negative.     Allergies  Naproxen and Sulfa antibiotics  Home Medications  No current outpatient prescriptions on file.  BP 118/64 mmHg  Pulse 81  Temp(Src) 98.2 F (36.8 C) (Oral)   Resp 16  Ht 5\' 2"  (1.575 m)  Wt 168 lb (76.204 kg)  BMI 30.72 kg/m2  SpO2 100%  LMP 05/10/2015  Physical Exam  Constitutional: She is oriented to person, place, and time. She appears well-developed and well-nourished.  HENT:  Head: Normocephalic.  Eyes: Pupils are equal, round, and reactive to light.  Neck: Normal range of motion.  Cardiovascular: Normal rate, regular rhythm, normal heart sounds and intact distal pulses.   Pulmonary/Chest: Effort normal and breath sounds normal.  Abdominal: There is tenderness. There is guarding.  Musculoskeletal: Normal range of motion.  Neurological: She is alert and oriented to person, place, and time. She has normal reflexes.  Skin: Skin is warm and dry.  Psychiatric: She has a normal mood and affect. Her behavior is normal. Judgment and thought content normal.    MAU Course  Procedures (including critical care time)  Labs Reviewed  WET PREP, GENITAL - Abnormal; Notable for the following:    Clue Cells Wet Prep HPF POC PRESENT (*)    WBC, Wet Prep HPF POC MANY (*)    All other components within normal limits  URINALYSIS, ROUTINE W REFLEX MICROSCOPIC (NOT AT [facility]) - Abnormal; Notable for the following:    Nitrite POSITIVE (*)    All other components within normal limits  URINE MICROSCOPIC-ADD ON - Abnormal; Notable for the following:    Squamous Epithelial / LPF 6-30 (*)    Bacteria, UA MANY (*)    All other components within normal limits   US Abdomen Limited  12/10/2015  CLINICAL DATA:  Umbilical hernia, pain at the belly button for 2 months EXAM: LIMITED ABDOMINAL ULTRASOUND COMPARISON:  None. FINDINGS: Real-time sonography of the periumbilical region is performed. There is hyperechoic material protruding through a defect in the abdominal wall most concerning a form an umbilical hernia likely containing fat and possibly a small amounts of bowel. There is no other fluid collection or hematoma. IMPRESSION: Hyperechoic material protruding  through a defect in the abdominal wall most concerning a form an umbilical hernia likely containing fat and possibly a small amounts of bowel. Electronically Signed   By: Elige Ko   On: 12/10/2015 09:57     1. Umbilical hernia without obstruction and without gangrene   2. Umbilical hernia with gangrene and obstruction       MDM  Incarcerated hernia Surgery consult PA and surgeon  in to see pt from surgery practice. MRI pending  2105 - Care assumed from Zerita Boers, CNM  Discussed MRI results with Radiology. No evidence of incarceration of bowel. No bowel in the herniation.  Discussed results with Dr. Gerrit Friends from General Surgery. He will have his team follow-up with the patient tomorrow. No need for emergent surgery.  Discussed with Dr. Jolayne Panther. Ok for discharge at this time with Rx for Percocet, follow-up with FT tomorrow.   A: [facility] at [redacted]w[redacted]d Umbilical hernia without incarceration  P: Discharge home Rx for Percocet given to patient Preterm labor precautions and warning signs for worsening condition discussed Patient advised to follow-up with [facility] tomorrow and Surgery as indicated Patient may return to MAU as needed or if her condition were to change or worsen  Marny Lowenstein, PA-C  12/10/2015 9:42 PM

## 2015-12-10 NOTE — ED Notes (Signed)
Pt taken of FHR monitor per ED MD and [facility] RN Katie.

## 2015-12-10 NOTE — ED Notes (Signed)
Ultrasound at bedside

## 2015-12-10 NOTE — ED Notes (Signed)
MD Gar Ponto at bedside.

## 2015-12-10 NOTE — ED Notes (Signed)
Pt states she is having cramping to middle abdomen with movement into lower abdomen. Described at sharp and cramping. Pt has hx of hernia and thinks her pain could be related to this. Her pain has persisted for months but worse over the last week. Pt also expresses she is having some episodes of emesis. In addition, pt says she has feeling of leaking coming from her vagina.  Pt see's Dr. Emelda Fear with [facility]. Due date is March 4th.

## 2015-12-10 NOTE — Progress Notes (Signed)
Patient ID: Mary Lowe, female   DOB: 03-11-1981, 34 y.o.   MRN: [id number]  General Surgery [facility]  Discussed patient with Dr. Chevis Pretty this evening.  Reviewed MRI of abdomen with radiologist by telephone this evening.  Study shows very small periumbilical hernia with sac measuring 1.4 cm and containing a tiny amount of pre-peritoneal fatty tissue.  No bowel in hernia.  No sign of incarceration.  No need for urgent surgical intervention.  Doubt severe pain is due to this small hernia.  Consider other etiologies.  Will follow patient as surgical consultation.  Discussed with staff in Maternity Admissions by telephone.  Velora Heckler, MD, [facility] Office: [phone]

## 2015-12-10 NOTE — ED Notes (Signed)
Pt getting an abdominal u/s at this time; efm not tracing

## 2015-12-10 NOTE — ED Notes (Signed)
Spoke with Mary Lowe at [facility]. Monitoring pt/fetal activity. Will give call back when monitoring is complete.

## 2015-12-10 NOTE — Consult Note (Signed)
Mary Lowe 20-Feb-1981  [id number].   Requesting MD: Dr. Mallory Shirk Chief Complaint/Reason for Consult: umbilical hernia HPI: This is a 34 yo white female who is [redacted] weeks pregnant who developed an umbilical hernia during this pregnancy.  It has intermittently caused her symptoms such as pain, but worsened over the last couple of days.  She states it comes out and then goes back in and is painful.  She had some nausea and had one episode of emesis this morning.  She had a BM yesterday and is still passing some flatus.  She went to [facility] this morning where Dr. Glo Herring saw her and she had an [place] that revealed an umbilical hernia with some type of content, likely fat, but small bowel couldn't be completely ruled out.  She was transferred to [facility] hospital for [place] to evaluate and determine whether she needed an operation or not.  Labs were ordered upon our arrival.  Her BMET is normal, but her WBC is 18K.  It was 14K 3 days ago in the office for her glucose tolerance test.  ROS : Please see HPI, otherwise negative  Family History  Problem Relation Age of Onset  . Depression Mother   . Depression Father   . Heart disease Father   . Alcohol abuse Father   . Hypertension Father   . Anesthesia problems Neg Hx   . Hypotension Neg Hx   . Malignant hyperthermia Neg Hx   . Pseudochol deficiency Neg Hx   . Alcohol abuse Paternal Uncle   . Diabetes Maternal Grandfather   . Alcohol abuse Paternal Grandmother   . Alcohol abuse Paternal Grandfather   . Cancer Paternal Grandfather     lung  . Cancer Maternal Grandmother     Past Medical History  Diagnosis Date  . IBS (irritable bowel syndrome)   . Crohn disease [facility])     Past Surgical History  Procedure Laterality Date  . Tonsillectomy    . Diagnostic laparoscopy with removal of ectopic pregnancy      Social History:  reports that she has been smoking Cigarettes.  She has been smoking about 0.50 packs per day. She has never used  smokeless tobacco. She reports that she does not drink alcohol or use illicit drugs.  Allergies:  Allergies  Allergen Reactions  . Naproxen Nausea And Vomiting  . Sulfa Antibiotics Hives    Medications Prior to Admission  Medication Sig Dispense Refill  . acetaminophen (TYLENOL) 500 MG tablet Take 500 mg by mouth every 6 (six) hours as needed for moderate pain.    Marland Kitchen metoCLOPramide (REGLAN) 10 MG tablet Take 1 tablet (10 mg total) by mouth 3 (three) times daily with meals. 84 tablet 1  . Prenatal Multivit-Min-Fe-FA (PRENATAL VITAMINS) 0.8 MG tablet Take 1 tablet by mouth daily. 30 tablet 0  . PROAIR HFA 108 (90 BASE) MCG/ACT inhaler Inhale 2 puffs into the lungs daily as needed.  0  . promethazine (PHENERGAN) 25 MG tablet TAKE 0.5-1 TABLETS (12.5-25 MG TOTAL) BY MOUTH EVERY 6 (SIX) HOURS AS NEEDED FOR NAUSEA OR VOMITING. 30 tablet 3    Blood pressure 118/64, pulse 81, temperature 98.2 F (36.8 C), temperature source Oral, resp. rate 16, height _0  (1.575 m), weight 76.204 kg (168 lb), last menstrual period 05/10/2015, SpO2 100 %. Physical Exam: General: pleasant, WD, WN white female who is laying in bed tearful from frustration and pain HEENT: head is normocephalic, atraumatic.  Sclera are noninjected.  PERRL.  Ears  and nose without any masses or lesions.  Mouth is pink and moist Heart: regular, rate, and rhythm.  Normal s1,s2. No obvious murmurs, gallops, or rubs noted.  Palpable radial and pedal pulses bilaterally Lungs: CTAB, no wheezes, rhonchi, or rales noted.  Respiratory effort nonlabored Abd: soft, tender at her umbilicus.  Hernia is present but reduces almost completely with just laying her head down.  Her hernia is quite soft and is not hard; however, she is very tender.  She is mild tender elsewhere.  She is obviously gravid and uterus is palpable.  +BS MS: all 4 extremities are symmetrical with no cyanosis, clubbing, or edema. Skin: warm and dry with no masses, lesions, or  rashes Psych: A&Ox3 with an appropriate affect.    Results for orders placed or performed during the hospital encounter of 12/10/15 (from the past 48 hour(s))  Urinalysis, Routine w reflex microscopic (not at [facility])     Status: Abnormal   Collection Time: 12/10/15  7:40 AM  Result Value Ref Range   Color, Urine YELLOW YELLOW   APPearance CLEAR CLEAR   Specific Gravity, Urine 1.015 1.005 - 1.030   pH 6.0 5.0 - 8.0   Glucose, UA NEGATIVE NEGATIVE mg/dL   Hgb urine dipstick NEGATIVE NEGATIVE   Bilirubin Urine NEGATIVE NEGATIVE   Ketones, ur NEGATIVE NEGATIVE mg/dL   Protein, ur NEGATIVE NEGATIVE mg/dL   Nitrite POSITIVE (A) NEGATIVE   Leukocytes, UA NEGATIVE NEGATIVE  Urine microscopic-add on     Status: Abnormal   Collection Time: 12/10/15  7:40 AM  Result Value Ref Range   Squamous Epithelial / LPF 6-30 (A) NONE SEEN   WBC, UA 0-5 0 - 5 WBC/hpf   RBC / HPF NONE SEEN 0 - 5 RBC/hpf   Bacteria, UA MANY (A) NONE SEEN  Wet prep, genital     Status: Abnormal   Collection Time: 12/10/15  8:30 AM  Result Value Ref Range   Yeast Wet Prep HPF POC NONE SEEN NONE SEEN   Trich, Wet Prep NONE SEEN NONE SEEN   Clue Cells Wet Prep HPF POC PRESENT (A) NONE SEEN   WBC, Wet Prep HPF POC MANY (A) NONE SEEN   Sperm NONE SEEN   CBC     Status: Abnormal   Collection Time: 12/10/15  2:26 PM  Result Value Ref Range   WBC 18.3 (H) 4.0 - 10.5 K/uL   RBC 3.63 (L) 3.87 - 5.11 MIL/uL   Hemoglobin 11.0 (L) 12.0 - 15.0 g/dL   HCT 33.3 (L) 36.0 - 46.0 %   MCV 91.7 78.0 - 100.0 fL   MCH 30.3 26.0 - 34.0 pg   MCHC 33.0 30.0 - 36.0 g/dL   RDW 13.5 11.5 - 15.5 %   Platelets 220 150 - 400 K/uL  Basic metabolic panel     Status: Abnormal   Collection Time: 12/10/15  2:26 PM  Result Value Ref Range   Sodium 139 135 - 145 mmol/L   Potassium 3.8 3.5 - 5.1 mmol/L   Chloride 109 101 - 111 mmol/L   CO2 21 (L) 22 - 32 mmol/L   Glucose, Bld 81 65 - 99 mg/dL   BUN 6 6 - 20 mg/dL   Creatinine, Ser 0.46 0.44 -  1.00 mg/dL   Calcium 8.4 (L) 8.9 - 10.3 mg/dL   GFR calc non Af Amer >60 >60 mL/min   GFR calc Af Amer >60 >60 mL/min    Comment: (NOTE) The eGFR has been calculated  using the CKD EPI equation. This calculation has not been validated in all clinical situations. eGFR's persistently <60 mL/min signify possible Chronic Kidney Disease.    Anion gap 9 5 - 15   US Abdomen Limited  12/10/2015  CLINICAL DATA:  Umbilical hernia, pain at the belly button for 2 months EXAM: LIMITED ABDOMINAL ULTRASOUND COMPARISON:  None. FINDINGS: Real-time sonography of the periumbilical region is performed. There is hyperechoic material protruding through a defect in the abdominal wall most concerning a form an umbilical hernia likely containing fat and possibly a small amounts of bowel. There is no other fluid collection or hematoma. IMPRESSION: Hyperechoic material protruding through a defect in the abdominal wall most concerning a form an umbilical hernia likely containing fat and possibly a small amounts of bowel. Electronically Signed   By: Kathreen Devoid   On: 12/10/2015 09:57       Assessment/Plan 1. 29 weeks gravid with viable intra-uterine fetus -per OB 2. Umbilical hernia -the patient's hernia is soft and seems reducible.  She has tremendous pain with this.  She will need an MRI to better evaluate whether she has a loop of bowel or omentum that is not being completely reduced. -if bowel is not present in her hernia, we would not ideally recommend an operation until she had delivered.  There are several reasons for this.  First, even though she is in her third trimester, it is ultimately safest for baby to not undergo an operation.  Second, given the patient still has 11 weeks for her abdomen to stretch, her repair will likely not hold and her hernia will come right back.   -if bowel is present, then she will likely require urgent operation for repair to reduce the risk of necrotic bowel.  The same risk still  remains that her repair may fall apart and hernia return as well as risk to the fetus, but in this instance, if she had necrotic bowel, those risks are more acceptable. 3. Leukocytosis -unclear right now what this is coming from.  If she has bowel present, then it may be coming from this, but her WBC was elevated 3 days ago in her OB's office during her glucose tolerance test.  Her current UA is unimpressive for a UTI.  -will await MRI results for further recommendations.  If she requires an operation, she will likely need this done at [facility] hospital for fetal monitoring etc.  Faye Strohman E 12/10/2015, 3:53 PM Pager: [phone]

## 2015-12-10 NOTE — ED Notes (Signed)
Pt. placed on fetal monitor.

## 2015-12-10 NOTE — MAU Note (Signed)
Patient presents from [facility] with an umbilical hernia. Abdominal pain 7/10. No other complaints. No bleeding or leaking. Fetus active.

## 2015-12-10 NOTE — Telephone Encounter (Signed)
Pt  Waiting for MRI, recommendations for urgent surgery or delayed surgery to depend on MRI results.  Pt reassured by call to some degree.

## 2015-12-10 NOTE — Progress Notes (Signed)
Pt informed of need for transfer , surgical consult and probable surgery today. Pt agrees with plan. Pt requesting pain meds, will tx x 1mg  dilaudid x 1

## 2015-12-10 NOTE — MAU Note (Signed)
Urine in lab 

## 2015-12-10 NOTE — ED Notes (Signed)
RROB called to notify of fhr not tracing; pt is getting a pelvic exam at this time, will adjust efm when finished with exam.

## 2015-12-10 NOTE — ED Notes (Signed)
Per Florentina Addison at [facility], patient is cleared to remove fetal monitoring.

## 2015-12-10 NOTE — ED Notes (Signed)
PT requesting an update. MD Hyacinth Meeker notified.

## 2015-12-10 NOTE — Progress Notes (Signed)
Patient ID: Mary Lowe, female   DOB: 1981-01-22, 34 y.o.   MRN: [id number]  History   Ob Consult     CSN: [id number]  Arrival date and time: 12/10/15 0715   None     Chief Complaint  Patient presents with  . Abdominal Pain   HPI  See Dr Rondel Baton note from this a.m.        34 yr female with known small umbilical hernia on right side of umbilicus, that was minimally symptomatic til this weekend, has become acutely tender, pt unable to sleep, and unable to tolerate contact to umbilicus.  No vomiting but has been nauseous this a.m Workup here includes fetal monitoring that confirms fetal wellbeing and no contractions, and speculum exam shows no evidence of rupture of membranes, with negative fern and Nitrazine PH5.   Ultrasound here confirms a small umbilical hernia, with u/s report equivocal as to whether there is small bowel involvement.     Past Medical History  Diagnosis Date  . IBS (irritable bowel syndrome)   . Crohn disease [facility])     Past Surgical History  Procedure Laterality Date  . Tonsillectomy    . Ectopic pregnancy surgery      Family History  Problem Relation Age of Onset  . Depression Mother   . Depression Father   . Heart disease Father   . Alcohol abuse Father   . Hypertension Father   . Anesthesia problems Neg Hx   . Hypotension Neg Hx   . Malignant hyperthermia Neg Hx   . Pseudochol deficiency Neg Hx   . Alcohol abuse Paternal Uncle   . Diabetes Maternal Grandfather   . Alcohol abuse Paternal Grandmother   . Alcohol abuse Paternal Grandfather   . Cancer Paternal Grandfather     lung  . Cancer Maternal Grandmother     Social History  Substance Use Topics  . Smoking status: Former Smoker -- 0.50 packs/day    Types: Cigarettes  . Smokeless tobacco: Never Used  . Alcohol Use: No    Allergies:  Allergies  Allergen Reactions  . Naproxen Nausea And Vomiting  . Sulfa Antibiotics Hives     (Not in a hospital  admission)  ROS Physical Exam   Blood pressure 105/74, pulse 85, temperature 98.1 F (36.7 C), temperature source Oral, resp. rate 16, height  (1.575 m), weight 76.204 kg (168 lb), last menstrual period 05/10/2015, SpO2 99 %.  Physical Exam  Constitutional: She is oriented to person, place, and time. She appears well-developed and well-nourished.  HENT:  Head: Normocephalic.  Eyes: Pupils are equal, round, and reactive to light.  Neck: Neck supple.  Cardiovascular: Normal rate.   Respiratory: Effort normal.  GI: Soft. Bowel sounds are normal. She exhibits mass. She exhibits no distension. There is tenderness. There is guarding.  1.5 cm acutely tender umbilical hernia  Genitourinary: Vagina normal.  Fern test negative, nitrazine 5.0, neg,  Physiologic secretions, no suspicion of rom.  Musculoskeletal: Normal range of motion. She exhibits no edema.  Neurological: She is alert and oriented to person, place, and time.  Psychiatric: She has a normal mood and affect. Her behavior is normal. Judgment and thought content normal.    MAU Course  Procedures  MDM Consult ,exam , transfer  Assessment and Plan  Incarcerated small umbilical hernia,  U/s Equivocal for small bowel involvement  Plan  Consulted General Surgery, as per their recommendations, will keep NPO, transfer to [facility], have the [employer]  consulted for assessment and possible surgical correction today.  Mary Lowe 12/10/2015, 11:25 AM

## 2015-12-11 ENCOUNTER — Telehealth: Payer: Self-pay | Admitting: *Deleted

## 2015-12-11 ENCOUNTER — Encounter (HOSPITAL_COMMUNITY): Payer: Self-pay

## 2015-12-11 ENCOUNTER — Ambulatory Visit (INDEPENDENT_AMBULATORY_CARE_PROVIDER_SITE_OTHER): Payer: Medicaid Other | Admitting: Obstetrics & Gynecology

## 2015-12-11 ENCOUNTER — Telehealth: Payer: Self-pay | Admitting: Obstetrics and Gynecology

## 2015-12-11 VITALS — BP 122/72 | HR 90 | Wt 167.5 lb

## 2015-12-11 DIAGNOSIS — Z1389 Encounter for screening for other disorder: Secondary | ICD-10-CM

## 2015-12-11 DIAGNOSIS — F1210 Cannabis abuse, uncomplicated: Secondary | ICD-10-CM

## 2015-12-11 DIAGNOSIS — O360130 Maternal care for anti-D [Rh] antibodies, third trimester, not applicable or unspecified: Secondary | ICD-10-CM | POA: Diagnosis not present

## 2015-12-11 DIAGNOSIS — Z3493 Encounter for supervision of normal pregnancy, unspecified, third trimester: Secondary | ICD-10-CM

## 2015-12-11 DIAGNOSIS — Z23 Encounter for immunization: Secondary | ICD-10-CM | POA: Diagnosis not present

## 2015-12-11 DIAGNOSIS — F1290 Cannabis use, unspecified, uncomplicated: Secondary | ICD-10-CM

## 2015-12-11 DIAGNOSIS — Z331 Pregnant state, incidental: Secondary | ICD-10-CM

## 2015-12-11 LAB — POCT URINALYSIS DIPSTICK
Glucose, UA: NEGATIVE
Ketones, UA: NEGATIVE
LEUKOCYTES UA: NEGATIVE
NITRITE UA: NEGATIVE
RBC UA: NEGATIVE

## 2015-12-11 MED ORDER — PROMETHAZINE HCL 25 MG PO TABS: 25.0000 mg | ORAL_TABLET | Freq: Four times a day (QID) | ORAL | Status: DC | PRN

## 2015-12-11 MED ORDER — HYDROCODONE-ACETAMINOPHEN 5-325 MG PO TABS: 1.0000 | ORAL_TABLET | Freq: Two times a day (BID) | ORAL | Status: DC

## 2015-12-11 MED ORDER — RHO D IMMUNE GLOBULIN 1500 UNIT/2ML IJ SOSY
300.0000 ug | PREFILLED_SYRINGE | Freq: Once | INTRAMUSCULAR | Status: AC
  Administered 2015-12-11: 300 ug via INTRAMUSCULAR

## 2015-12-11 NOTE — Addendum Note (Signed)
Addended by: Lazaro Arms on: 12/11/2015 11:45 AM   Modules accepted: Orders

## 2015-12-11 NOTE — Addendum Note (Signed)
Addended by: Jacklyn Shell on: 12/11/2015 11:03 AM   Modules accepted: Orders

## 2015-12-11 NOTE — Addendum Note (Signed)
Addended by: Criss Alvine on: 12/11/2015 11:04 AM   Modules accepted: Orders

## 2015-12-11 NOTE — Telephone Encounter (Signed)
Pt aware Hydrocodone canceled will discuss pain management after surgery tomorrow per Dr. Despina Hidden. Pt verbalized understanding.

## 2015-12-11 NOTE — Patient Instructions (Signed)

## 2015-12-11 NOTE — MAU Note (Signed)
Patient's obix strip wasn't discharged when the patient was discharged so another patient's obix strip was recorded from [id number]

## 2015-12-11 NOTE — Progress Notes (Signed)
[id number] [redacted]w[redacted]d Estimated Date of Delivery: 02/23/16  Blood pressure 122/72, pulse 90, weight 167 lb 8 oz (75.978 kg), last menstrual period 05/10/2015.   BP weight and urine results all reviewed and noted.  Please refer to the obstetrical flow sheet for the fundal height and fetal heart rate documentation:  Patient reports good fetal movement, denies any bleeding and no rupture of membranes symptoms or regular contractions. Patient is without complaints. All questions were answered.  Orders Placed This Encounter  Procedures  . POCT urinalysis dipstick    Plan:  Continued routine obstetrical care,   Return in about 2 weeks (around 12/25/2015) for LROB.

## 2015-12-11 NOTE — Progress Notes (Signed)
[id number] [redacted]w[redacted]d Estimated Date of Delivery: 02/23/16  Blood pressure 122/72, pulse 90, weight 167 lb 8 oz (75.978 kg), last menstrual period 05/10/2015.   BP weight and urine results all reviewed and noted.  Please refer to the obstetrical flow sheet for the fundal height and fetal heart rate documentation:  Patient reports good fetal movement, denies any bleeding and no rupture of membranes symptoms or regular contractions. Patient had been seen last week for severe umbilicul pain--has a hernia, MRI did not show strangulation/bowel.  Plan was to try to manage until postpartum with narcotics/ice/binder.  However, pt's pain is still severe.  Co exam with Dr. Despina Hidden, who does not feel that this is manageable for 11  More weeks, and risks of surgery to release probable omentum outweigh the risks of PTL asso with this severity of pain.  All questions were answered.  Orders Placed This Encounter  Procedures  . POCT urinalysis dipstick   Rx Phenergan 25mg  # 30 per request rx Hydrocodone 5/325 # 20 no refills Rx Abdominal binder  Plan:  Care turned over to Dr .Despina Hidden  Return in about 2 weeks (around 12/25/2015) for LROB.

## 2015-12-11 NOTE — Telephone Encounter (Signed)
I called pharmacy and cancelled the hydrocodone, will address after surgery tomoroow

## 2015-12-11 NOTE — Telephone Encounter (Signed)
[place] with  [facility], [place] states pt brought 2 RX for pain medications to get filled today, one for Hydrocodone Dr. Despina Hidden prescribed and Rx for [employer]. Is Dr. Despina Hidden aware pt has both medications?

## 2015-12-11 NOTE — Telephone Encounter (Signed)
Pt did not have herniorraphy last pm.  MRI showed incarcerated omentum, but no suspected infarction or bowel involvement.  Dr Carolynne Edouard wants to wait to see if hernia becomes less symptomatic as pregnancy progresses.  Pt contacted by phone: we can try oral analgesics to get thru acute current discomfort, ice pack prn, and surgery only if oral agents simply intolerable./ inadequate. I do believe the visceral omental pain is the source of her discomfort.  Pt willing to try to wait out current pain episode with analgesics,  If surgery required would probably be done by Ob/Gyn, and use perioperative tocolytics with indocin or procardia with overnite observation.

## 2015-12-12 ENCOUNTER — Ambulatory Visit (HOSPITAL_COMMUNITY): Payer: Medicaid Other | Admitting: Anesthesiology

## 2015-12-12 ENCOUNTER — Encounter (HOSPITAL_COMMUNITY): Payer: Self-pay | Admitting: Anesthesiology

## 2015-12-12 ENCOUNTER — Encounter (HOSPITAL_COMMUNITY)
Admission: RE | Disposition: A | Discharge status: Home / Self Care | Payer: Self-pay | Source: Ambulatory Visit | Attending: Obstetrics & Gynecology

## 2015-12-12 ENCOUNTER — Ambulatory Visit (HOSPITAL_COMMUNITY)
Admission: RE | Admit: 2015-12-12 | Discharge: 2015-12-12 | Disposition: A | Discharge status: Home / Self Care | Payer: Medicaid Other | Source: Ambulatory Visit | Attending: Obstetrics & Gynecology | Admitting: Obstetrics & Gynecology

## 2015-12-12 DIAGNOSIS — K430 Incisional hernia with obstruction, without gangrene: Secondary | ICD-10-CM | POA: Diagnosis not present

## 2015-12-12 DIAGNOSIS — K429 Umbilical hernia without obstruction or gangrene: Secondary | ICD-10-CM | POA: Diagnosis not present

## 2015-12-12 DIAGNOSIS — O26893 Other specified pregnancy related conditions, third trimester: Secondary | ICD-10-CM | POA: Insufficient documentation

## 2015-12-12 DIAGNOSIS — K420 Umbilical hernia with obstruction, without gangrene: Secondary | ICD-10-CM | POA: Diagnosis not present

## 2015-12-12 DIAGNOSIS — Z3A29 29 weeks gestation of pregnancy: Secondary | ICD-10-CM | POA: Insufficient documentation

## 2015-12-12 DIAGNOSIS — Z331 Pregnant state, incidental: Secondary | ICD-10-CM | POA: Diagnosis not present

## 2015-12-12 HISTORY — PX: UMBILICAL HERNIA REPAIR: SHX196

## 2015-12-12 HISTORY — DX: Gastro-esophageal reflux disease without esophagitis: K21.9

## 2015-12-12 HISTORY — DX: Other specified postprocedural states: Z98.890

## 2015-12-12 HISTORY — DX: Nausea with vomiting, unspecified: R11.2

## 2015-12-12 SURGERY — REPAIR, HERNIA, UMBILICAL, ADULT
Anesthesia: Spinal | Site: Abdomen

## 2015-12-12 MED ORDER — DROPERIDOL 2.5 MG/ML IJ SOLN
0.6250 mg | INTRAMUSCULAR | Status: DC | PRN
  Filled 2015-12-12: qty 0.25

## 2015-12-12 MED ORDER — FENTANYL CITRATE (PF) 100 MCG/2ML IJ SOLN
INTRAMUSCULAR | Status: AC
  Filled 2015-12-12: qty 2

## 2015-12-12 MED ORDER — ONDANSETRON HCL 4 MG/2ML IJ SOLN
INTRAMUSCULAR | Status: DC | PRN
  Administered 2015-12-12: 4 mg via INTRAVENOUS

## 2015-12-12 MED ORDER — MEPERIDINE HCL 25 MG/ML IJ SOLN: 6.2500 mg | INTRAMUSCULAR | Status: DC | PRN

## 2015-12-12 MED ORDER — PHENYLEPHRINE HCL 10 MG/ML IJ SOLN
INTRAMUSCULAR | Status: DC | PRN
  Administered 2015-12-12 (×4): 40 ug via INTRAVENOUS

## 2015-12-12 MED ORDER — PROPOFOL 10 MG/ML IV BOLUS
INTRAVENOUS | Status: AC
  Filled 2015-12-12: qty 20

## 2015-12-12 MED ORDER — ACETAMINOPHEN 160 MG/5ML PO SOLN: 325.0000 mg | ORAL | Status: DC | PRN

## 2015-12-12 MED ORDER — PROPOFOL 500 MG/50ML IV EMUL
INTRAVENOUS | Status: DC | PRN
  Administered 2015-12-12: 125 ug/kg/min via INTRAVENOUS

## 2015-12-12 MED ORDER — OXYCODONE-ACETAMINOPHEN 5-325 MG PO TABS
ORAL_TABLET | ORAL | Status: AC
  Administered 2015-12-12: 1 via ORAL
  Filled 2015-12-12: qty 1

## 2015-12-12 MED ORDER — CEFAZOLIN SODIUM-DEXTROSE 2-3 GM-% IV SOLR
2.0000 g | Freq: Once | INTRAVENOUS | Status: AC
  Administered 2015-12-12: 2 g via INTRAVENOUS

## 2015-12-12 MED ORDER — FENTANYL CITRATE (PF) 100 MCG/2ML IJ SOLN
25.0000 ug | INTRAMUSCULAR | Status: DC | PRN
  Administered 2015-12-12 (×3): 50 ug via INTRAVENOUS

## 2015-12-12 MED ORDER — CEFAZOLIN SODIUM-DEXTROSE 2-3 GM-% IV SOLR
INTRAVENOUS | Status: AC
  Filled 2015-12-12: qty 50

## 2015-12-12 MED ORDER — FENTANYL CITRATE (PF) 100 MCG/2ML IJ SOLN
INTRAMUSCULAR | Status: DC | PRN
  Administered 2015-12-12 (×4): 50 ug via INTRAVENOUS

## 2015-12-12 MED ORDER — FENTANYL CITRATE (PF) 100 MCG/2ML IJ SOLN
INTRAMUSCULAR | Status: AC
  Administered 2015-12-12: 50 ug via INTRAVENOUS
  Filled 2015-12-12: qty 2

## 2015-12-12 MED ORDER — LACTATED RINGERS IV SOLN
INTRAVENOUS | Status: DC
  Administered 2015-12-12 (×4): via INTRAVENOUS

## 2015-12-12 MED ORDER — PROMETHAZINE HCL 25 MG/ML IJ SOLN
INTRAMUSCULAR | Status: AC
  Administered 2015-12-12: 12.5 mg via INTRAVENOUS
  Filled 2015-12-12: qty 1

## 2015-12-12 MED ORDER — PROMETHAZINE HCL 25 MG/ML IJ SOLN
6.2500 mg | INTRAMUSCULAR | Status: DC | PRN
  Administered 2015-12-12: 12.5 mg via INTRAVENOUS

## 2015-12-12 MED ORDER — OXYCODONE-ACETAMINOPHEN 5-325 MG PO TABS: 1.0000 | ORAL_TABLET | ORAL | Status: DC | PRN

## 2015-12-12 MED ORDER — ACETAMINOPHEN 325 MG PO TABS: 325.0000 mg | ORAL_TABLET | ORAL | Status: DC | PRN

## 2015-12-12 MED ORDER — SCOPOLAMINE 1 MG/3DAYS TD PT72
1.0000 | MEDICATED_PATCH | Freq: Once | TRANSDERMAL | Status: DC
  Administered 2015-12-12: 1.5 mg via TRANSDERMAL

## 2015-12-12 MED ORDER — OXYCODONE-ACETAMINOPHEN 5-325 MG PO TABS
1.0000 | ORAL_TABLET | Freq: Once | ORAL | Status: AC
  Administered 2015-12-12: 1 via ORAL

## 2015-12-12 MED ORDER — SCOPOLAMINE 1 MG/3DAYS TD PT72
MEDICATED_PATCH | TRANSDERMAL | Status: AC
  Filled 2015-12-12: qty 1

## 2015-12-12 MED ORDER — BUPIVACAINE IN DEXTROSE 0.75-8.25 % IT SOLN
INTRATHECAL | Status: DC | PRN
  Administered 2015-12-12: 12 mg via INTRATHECAL

## 2015-12-12 MED ORDER — PHENYLEPHRINE 40 MCG/ML (10ML) SYRINGE FOR IV PUSH (FOR BLOOD PRESSURE SUPPORT)
PREFILLED_SYRINGE | INTRAVENOUS | Status: AC
  Filled 2015-12-12: qty 10

## 2015-12-12 MED ORDER — BUPIVACAINE LIPOSOME 1.3 % IJ SUSP
20.0000 mL | Freq: Once | INTRAMUSCULAR | Status: AC
  Administered 2015-12-12: 20 mL
  Filled 2015-12-12: qty 20

## 2015-12-12 SURGICAL SUPPLY — 26 items
CATH ROBINSON RED A/P 16FR (CATHETERS) IMPLANT
CLOTH BEACON ORANGE TIMEOUT ST (SAFETY) ×2 IMPLANT
DRSG OPSITE POSTOP 3X4 (GAUZE/BANDAGES/DRESSINGS) ×2 IMPLANT
ELECT REM PT RETURN 9FT ADLT (ELECTROSURGICAL) ×2
ELECTRODE REM PT RTRN 9FT ADLT (ELECTROSURGICAL) ×1 IMPLANT
GLOVE BIOGEL PI IND STRL 7.0 (GLOVE) ×1 IMPLANT
GLOVE BIOGEL PI IND STRL 8 (GLOVE) ×1 IMPLANT
GLOVE BIOGEL PI INDICATOR 7.0 (GLOVE) ×1
GLOVE BIOGEL PI INDICATOR 8 (GLOVE) ×1
GLOVE ECLIPSE 8.0 STRL XLNG CF (GLOVE) ×2 IMPLANT
GOWN STRL REUS W/TWL LRG LVL3 (GOWN DISPOSABLE) ×4 IMPLANT
LIQUID BAND (GAUZE/BANDAGES/DRESSINGS) ×2 IMPLANT
NEEDLE HYPO 22GX1.5 SAFETY (NEEDLE) ×2 IMPLANT
NS IRRIG 1000ML POUR BTL (IV SOLUTION) ×2 IMPLANT
PACK ABDOMINAL MINOR (CUSTOM PROCEDURE TRAY) ×2 IMPLANT
PENCIL BUTTON HOLSTER BLD 10FT (ELECTRODE) ×2 IMPLANT
SPONGE GAUZE 4X4 12PLY STER LF (GAUZE/BANDAGES/DRESSINGS) ×2 IMPLANT
SPONGE LAP 4X18 X RAY DECT (DISPOSABLE) ×2 IMPLANT
SUT PROLENE 0 CT 1 30 (SUTURE) ×6 IMPLANT
SUT VIC AB 3-0 SH 27 (SUTURE) ×1
SUT VIC AB 3-0 SH 27X BRD (SUTURE) ×1 IMPLANT
SUT VIC AB 4-0 PS2 27 (SUTURE) ×4 IMPLANT
SYR CONTROL 10ML LL (SYRINGE) ×2 IMPLANT
TAPE HYPAFIX 4 X10 (GAUZE/BANDAGES/DRESSINGS) ×2 IMPLANT
TOWEL OR 17X24 6PK STRL BLUE (TOWEL DISPOSABLE) ×4 IMPLANT
TRAY FOLEY CATH SILVER 14FR (SET/KITS/TRAYS/PACK) ×2 IMPLANT

## 2015-12-12 NOTE — Anesthesia Postprocedure Evaluation (Signed)
Anesthesia Post Note  Patient: Mary Lowe  Procedure(s) Performed: Procedure(s) (LRB): HERNIA REPAIR UMBILICAL ADULT (N/A)  Patient location during evaluation: PACU Anesthesia Type: Spinal Level of consciousness: awake and alert Pain management: pain level controlled Vital Signs Assessment: post-procedure vital signs reviewed and stable Respiratory status: spontaneous breathing, nonlabored ventilation, respiratory function stable and patient connected to nasal cannula oxygen Cardiovascular status: blood pressure returned to baseline and stable Postop Assessment: no signs of nausea or vomiting, spinal receding and patient able to bend at knees Anesthetic complications: no    Last Vitals:  Filed Vitals:   12/12/15 1700 12/12/15 1715  BP: 118/81 119/75  Pulse: 105 94  Temp: 36.9 C   Resp: 20 0    Last Pain:  Filed Vitals:   12/12/15 1747  PainSc: 3                  Ethan Kasperski JENNETTE

## 2015-12-12 NOTE — H&P (Signed)
Preoperative History and Physical  Mary Lowe is a 34 y.o. [phone] with Patient's last menstrual period was 05/10/2015. [redacted]w[redacted]d Estimated Date of Delivery: 02/23/16 admitted for a incisional umbilical hernia repair with resection of omentum if needed.  Pt has been having pain for about 1 week, significant increase in last 2-3 days.  Pain is sharp and intense constant.  MRI reveals fat in a 17 mm defect of the fascia.  Based on her symptoms she has incarcerated omentum.  No bowel involved.  I tried for 15 minutes to reduce in office without success.  As a result brought to OR for surgical repair   PMH:    Past Medical History  Diagnosis Date  . IBS (irritable bowel syndrome)   . Crohn disease (HCC)   . GERD (gastroesophageal reflux disease)   . PONV (postoperative nausea and vomiting)     PSH:     Past Surgical History  Procedure Laterality Date  . Tonsillectomy    . Diagnostic laparoscopy with removal of ectopic pregnancy    . Colonoscopy    . Upper gi endoscopy      POb/GynH:      OB History    Gravida Para Term Preterm AB TAB SAB Ectopic Multiple Living   0 3 0 2 1 0 3      SH:   Social History  Substance Use Topics  . Smoking status: Current Every Day Smoker -- 0.50 packs/day for 18 years    Types: Cigarettes  . Smokeless tobacco: Never Used  . Alcohol Use: No    FH:    Family History  Problem Relation Age of Onset  . Depression Mother   . Depression Father   . Heart disease Father   . Alcohol abuse Father   . Hypertension Father   . Anesthesia problems Neg Hx   . Hypotension Neg Hx   . Malignant hyperthermia Neg Hx   . Pseudochol deficiency Neg Hx   . Alcohol abuse Paternal Uncle   . Diabetes Maternal Grandfather   . Alcohol abuse Paternal Grandmother   . Alcohol abuse Paternal Grandfather   . Cancer Paternal Grandfather     lung  . Cancer Maternal Grandmother      Allergies:  Allergies  Allergen Reactions  . Naproxen Nausea And Vomiting   . Sulfa Antibiotics Hives    Medications:       Current facility-administered medications:  .  ceFAZolin (ANCEF) 2-3 GM-% IVPB SOLR, , , ,  .  ceFAZolin (ANCEF) IVPB 2 g/50 mL premix, 2 g, Intravenous, Once, Lazaro Arms, MD .  lactated ringers infusion, , Intravenous, Continuous, Leilani Able, MD, Last Rate: 125 mL/hr at 12/12/15 1238 .  scopolamine (TRANSDERM-SCOP) 1 MG/3DAYS 1.5 mg, 1 patch, Transdermal, Once, Leilani Able, MD, 1.5 mg at 12/12/15 1239 .  scopolamine (TRANSDERM-SCOP) 1 MG/3DAYS, , , ,   Review of Systems:   Review of Systems  Constitutional: Negative for fever, chills, weight loss, malaise/fatigue and diaphoresis.  HENT: Negative for hearing loss, ear pain, nosebleeds, congestion, sore throat, neck pain, tinnitus and ear discharge.   Eyes: Negative for blurred vision, double vision, photophobia, pain, discharge and redness.  Respiratory: Negative for cough, hemoptysis, sputum production, shortness of breath, wheezing and stridor.   Cardiovascular: Negative for chest pain, palpitations, orthopnea, claudication, leg swelling and PND.  Gastrointestinal: Positive for abdominal pain. Negative for heartburn, nausea, vomiting, diarrhea, constipation, blood in stool and melena.  Genitourinary: Negative for dysuria,  urgency, frequency, hematuria and flank pain.  Musculoskeletal: Negative for myalgias, back pain, joint pain and falls.  Skin: Negative for itching and rash.  Neurological: Negative for dizziness, tingling, tremors, sensory change, speech change, focal weakness, seizures, loss of consciousness, weakness and headaches.  Endo/Heme/Allergies: Negative for environmental allergies and polydipsia. Does not bruise/bleed easily.  Psychiatric/Behavioral: Negative for depression, suicidal ideas, hallucinations, memory loss and substance abuse. The patient is not nervous/anxious and does not have insomnia.      PHYSICAL EXAM:  Blood pressure 116/78, pulse 96,  temperature 98.1 F (36.7 C), temperature source Oral, resp. rate 20, last menstrual period 05/10/2015, SpO2 100 %.    Vitals reviewed. Constitutional: She is oriented to person, place, and time. She appears well-developed and well-nourished.  HENT:  Head: Normocephalic and atraumatic.  Right Ear: External ear normal.  Left Ear: External ear normal.  Nose: Nose normal.  Mouth/Throat: Oropharynx is clear and moist.  Eyes: Conjunctivae and EOM are normal. Pupils are equal, round, and reactive to light. Right eye exhibits no discharge. Left eye exhibits no discharge. No scleral icterus.  Neck: Normal range of motion. Neck supple. No tracheal deviation present. No thyromegaly present.  Cardiovascular: Normal rate, regular rhythm, normal heart sounds and intact distal pulses.  Exam reveals no gallop and no friction rub.   No murmur heard. Respiratory: Effort normal and breath sounds normal. No respiratory distress. She has no wheezes. She has no rales. She exhibits no tenderness.  GI: Soft. Bowel sounds are normal. She exhibits no distension and no mass. There is tenderness around the umbilicus with a 2x2 cm obvious mass protruding, worse with cough, valsalva. There is no rebound and no guarding.  Genitourinary:       Vulva is normal without lesions Vagina is pink moist without discharge Cervix normal in appearance and pap is normal Uterus is size equals dates Adnexa is negative with normal sized ovaries by sonogram  Musculoskeletal: Normal range of motion. She exhibits no edema and no tenderness.  Neurological: She is alert and oriented to person, place, and time. She has normal reflexes. She displays normal reflexes. No cranial nerve deficit. She exhibits normal muscle tone. Coordination normal.  Skin: Skin is warm and dry. No rash noted. No erythema. No pallor.  Psychiatric: She has a normal mood and affect. Her behavior is normal. Judgment and thought content normal.    Labs: Results  for orders placed or performed in visit on 12/11/15 (from the past 336 hour(s))  POCT urinalysis dipstick   Collection Time: 12/11/15 10:28 AM  Result Value Ref Range   Color, UA yellow    Clarity, UA clear    Glucose, UA neg    Bilirubin, UA     Ketones, UA neg    Spec Grav, UA     Blood, UA neg    pH, UA     Protein, UA trace    Urobilinogen, UA     Nitrite, UA neg    Leukocytes, UA Negative Negative  Results for orders placed or performed during the hospital encounter of 12/10/15 (from the past 336 hour(s))  Urinalysis, Routine w reflex microscopic (not at [facility])   Collection Time: 12/10/15  7:40 AM  Result Value Ref Range   Color, Urine YELLOW YELLOW   APPearance CLEAR CLEAR   Specific Gravity, Urine 1.015 1.005 - 1.030   pH 6.0 5.0 - 8.0   Glucose, UA NEGATIVE NEGATIVE mg/dL   Hgb urine dipstick NEGATIVE NEGATIVE   Bilirubin  Urine NEGATIVE NEGATIVE   Ketones, ur NEGATIVE NEGATIVE mg/dL   Protein, ur NEGATIVE NEGATIVE mg/dL   Nitrite POSITIVE (A) NEGATIVE   Leukocytes, UA NEGATIVE NEGATIVE  Urine microscopic-add on   Collection Time: 12/10/15  7:40 AM  Result Value Ref Range   Squamous Epithelial / LPF 6-30 (A) NONE SEEN   WBC, UA 0-5 0 - 5 WBC/hpf   RBC / HPF NONE SEEN 0 - 5 RBC/hpf   Bacteria, UA MANY (A) NONE SEEN  Wet prep, genital   Collection Time: 12/10/15  8:30 AM  Result Value Ref Range   Yeast Wet Prep HPF POC NONE SEEN NONE SEEN   Trich, Wet Prep NONE SEEN NONE SEEN   Clue Cells Wet Prep HPF POC PRESENT (A) NONE SEEN   WBC, Wet Prep HPF POC MANY (A) NONE SEEN   Sperm NONE SEEN   Urine rapid drug screen (hosp performed)   Collection Time: 12/10/15 12:40 PM  Result Value Ref Range   Opiates POSITIVE (A) NONE DETECTED   Cocaine NONE DETECTED NONE DETECTED   Benzodiazepines NONE DETECTED NONE DETECTED   Amphetamines NONE DETECTED NONE DETECTED   Tetrahydrocannabinol NONE DETECTED NONE DETECTED   Barbiturates POSITIVE (A) NONE DETECTED  CBC    Collection Time: 12/10/15  2:26 PM  Result Value Ref Range   WBC 18.3 (H) 4.0 - 10.5 K/uL   RBC 3.63 (L) 3.87 - 5.11 MIL/uL   Hemoglobin 11.0 (L) 12.0 - 15.0 g/dL   HCT 40.9 (L) 81.1 - 91.4 %   MCV 91.7 78.0 - 100.0 fL   MCH 30.3 26.0 - 34.0 pg   MCHC 33.0 30.0 - 36.0 g/dL   RDW 78.2 95.6 - 21.3 %   Platelets 220 150 - 400 K/uL  Basic metabolic panel   Collection Time: 12/10/15  2:26 PM  Result Value Ref Range   Sodium 139 135 - 145 mmol/L   Potassium 3.8 3.5 - 5.1 mmol/L   Chloride 109 101 - 111 mmol/L   CO2 21 (L) 22 - 32 mmol/L   Glucose, Bld 81 65 - 99 mg/dL   BUN 6 6 - 20 mg/dL   Creatinine, Ser 0.86 0.44 - 1.00 mg/dL   Calcium 8.4 (L) 8.9 - 10.3 mg/dL   GFR calc non Af Amer >60 >60 mL/min   GFR calc Af Amer >60 >60 mL/min   Anion gap 9 5 - 15  Results for orders placed or performed in visit on 12/06/15 (from the past 336 hour(s))  CBC   Collection Time: 12/06/15  9:11 AM  Result Value Ref Range   WBC 16.0 (H) 3.4 - 10.8 x10E3/uL   RBC 3.64 (L) 3.77 - 5.28 x10E6/uL   Hemoglobin 11.2 11.1 - 15.9 g/dL   Hematocrit 57.8 (L) 46.9 - 46.6 %   MCV 93 79 - 97 fL   MCH 30.8 26.6 - 33.0 pg   MCHC 33.0 31.5 - 35.7 g/dL   RDW 62.9 52.8 - 41.3 %   Platelets 237 150 - 379 x10E3/uL   NRBC 0 0-0 %  HIV antibody   Collection Time: 12/06/15  9:11 AM  Result Value Ref Range   HIV Screen 4th Generation wRfx Non Reactive Non Reactive  RPR   Collection Time: 12/06/15  9:11 AM  Result Value Ref Range   RPR Ser Ql Non Reactive Non Reactive  Glucose Tolerance, 2 Hours w/1 Hour   Collection Time: 12/06/15  9:11 AM  Result Value Ref Range  Glucose, Fasting 76 65 - 91 mg/dL   Glucose, 1 hour 161 65 - 179 mg/dL   Glucose, 2 hour 94 65 - 152 mg/dL  Antibody screen   Collection Time: 12/06/15  9:11 AM  Result Value Ref Range   Antibody Screen Negative Negative  Results for orders placed or performed in visit on 11/29/15 (from the past 336 hour(s))  POCT urinalysis dipstick    Collection Time: 11/29/15  9:26 AM  Result Value Ref Range   Color, UA     Clarity, UA     Glucose, UA neg    Bilirubin, UA     Ketones, UA neg    Spec Grav, UA     Blood, UA neg    pH, UA     Protein, UA neg    Urobilinogen, UA     Nitrite, UA positive    Leukocytes, UA Trace (A) Negative    EKG: No orders found for this or any previous visit.  Imaging Studies: Mr Abdomen Wo Contrast  12-21-2015  CLINICAL DATA:  Pregnant, umbilical hernia EXAM: MRI ABDOMEN WITHOUT CONTRAST TECHNIQUE: Multiplanar multisequence MR imaging was performed without the administration of intravenous contrast. COMPARISON:  Abdominal wall ultrasound dated 12/10/2015 FINDINGS: Small umbilical hernia containing fat (series 3/ image 62). Trace fluid. No bowel within the hernia. Gravid uterus. Placenta is left/ anterior and free of the cervical os. Vertex cephalic presentation. Dedicated fetal evaluation was not performed. Liver is notable for a 4.2 cm T2 hyperintense lesion in the posterior right hepatic dome (series 3/ image 11), likely reflecting a benign hemangioma. Spleen, pancreas, and adrenal glands are within normal limits. Gallbladder is unremarkable. No intrahepatic or extrahepatic ductal dilatation. 8 mm cyst in the posterior left upper kidney (series 3/ image 30). No hydronephrosis. Visualized bowel is unremarkable. IMPRESSION: Small fat containing umbilical hernia with trace fluid. No bowel within the hernia. Electronically Signed   By: Charline Bills M.D.   On: December 21, 2015 08:43   Mary Lowe  12/10/2015  FOLLOW Lowe SONOGRAM Terris Bodin Purkey is in the office for a follow Lowe sonogram for EFW. She is a 34 y.o. year old [phone] with Estimated Date of Delivery: 02/23/16 by early ultrasound now at  [redacted]w[redacted]d weeks gestation. Thus far the pregnancy has been complicated by HX of crohn's dz,umbilicial hernia,ama.. GESTATION: SINGLETON PRESENTATION: cephalic FETAL ACTIVITY:          Heart rate         138 bpm           The fetus is active. AMNIOTIC FLUID: The amniotic fluid volume is  normal, 4.3 cm. PLACENTA LOCALIZATION:  anterior GRADE 0 CERVIX: Measures 3.3 cm ADNEXA: The ovaries are normal. GESTATIONAL AGE AND  BIOMETRICS: Gestational criteria: Estimated Date of Delivery: 02/23/16 by early ultrasound now at [redacted]w[redacted]d Previous Scans:5          BIPARIETAL DIAMETER           7.2 cm         28+6 weeks HEAD CIRCUMFERENCE           25.72 cm         28 weeks ABDOMINAL CIRCUMFERENCE           24.36 cm         28+4 weeks FEMUR LENGTH           5.26 cm         28 weeks  AVERAGE EGA(BY THIS SCAN):  28+2 weeks                                                 ESTIMATED FETAL WEIGHT:       1251  grams, 65 % ANATOMICAL SURVEY                                                                            COMMENTS CEREBRAL VENTRICLES yes normal  CHOROID PLEXUS yes normal  CEREBELLUM yes normal  CISTERNA MAGNA yes normal  NUCHAL REGION yes normal  ORBITS yes normal  NASAL BONE yes normal  NOSE/LIP yes normal  FACIAL PROFILE yes normal  4 CHAMBERED HEART yes normal  OUTFLOW TRACTS yes normal  DIAPHRAGM yes normal  STOMACH yes normal  RENAL REGION yes normal  BLADDER yes normal  CORD INSERTION yes normal  3 VESSEL CORD yes normal  SPINE yes normal  ARMS/HANDS    LEGS/FEET    GENITALIA yes normal female     SUSPECTED ABNORMALITIES:  no QUALITY OF SCAN: satisfactory TECHNICIAN COMMENTS: Mary 27+5 wks,measurement c/w dates,cephalic,cx 3.3cm,ant pl gr 1,svp of fluid 4.3cm,normal ov's bilat,fhr 138bpm,efw 1251g 65% A copy of this report including all images has been saved and backed Lowe to a second source for retrieval if needed. All measures and details of the anatomical scan, placentation, fluid volume and pelvic anatomy are contained in that report. Mary Lowe 11/29/2015 9:05 AM Clinical Impression and recommendations: I have reviewed the sonogram results above. Combined with the patient's current clinical  course, below are my impressions and any appropriate recommendations for management based on the sonographic findings: 1. Singleton pregnancy, vertex presentation, with growth at 65%ile for gestational age. 2 generally normal anatomy confirmed. 3. Normal cervix, AF volume, and adnexal structures. Mary Lowe,Mary Lowe   Mary Abdomen Limited  12/10/2015  CLINICAL DATA:  Umbilical hernia, pain at the belly button for 2 months EXAM: LIMITED ABDOMINAL ULTRASOUND COMPARISON:  None. FINDINGS: Real-time sonography of the periumbilical region is performed. There is hyperechoic material protruding through a defect in the abdominal wall most concerning a form an umbilical hernia likely containing fat and possibly a small amounts of bowel. There is no other fluid collection or hematoma. IMPRESSION: Hyperechoic material protruding through a defect in the abdominal wall most concerning a form an umbilical hernia likely containing fat and possibly a small amounts of bowel. Electronically Signed   By: Elige Ko   On: 12/10/2015 09:57      Assessment: Symptomatic umbilical hernia, incisional, with omental incarceration [redacted]w[redacted]d Estimated Date of Delivery: 02/23/16  Plan: Exploration and repair of incisional umbilical hernia with release of omentum, partial resection of omentum if needed  the patient is aware of complications of bleeding and preterm labor, chiefly  Rockelle Heuerman H 12/12/2015 1:19 PM

## 2015-12-12 NOTE — Discharge Instructions (Signed)
Exploratory Laparotomy, Adult  Exploratory laparotomy is a surgical procedure to examine the organs inside your belly (abdomen). Another name for this is abdominal exploration. You may have this procedure if you have abdominal pain, trauma, bleeding, infection, or obstruction. The procedure may be done if your health care provider cannot make a diagnosis from only an exam and testing.  Exploratory laparotomy may be a planned procedure or an emergency procedure. You may have surgical treatment as part of the laparotomy, or you may have additional treatment after your laparotomy. This will depend on what your surgeon finds during the procedure.  LET YOUR HEALTH CARE PROVIDER KNOW ABOUT:   Any allergies you have.   All medicines you are taking, including vitamins, herbs, eye drops, creams, and over-the-counter medicines.   Previous problems you or members of your family have had with the use of anesthetics.   Any blood disorders you have.   Previous surgeries you have had.   Medical conditions you have.  RISKS AND COMPLICATIONS  Generally, this is a safe procedure. However, problems can occur and include:   Bleeding.   Infection.   A blood clot that forms in your leg and travels to your lungs.   Damage to organs inside your abdomen.   Scar tissue that blocks your digestive tract.  BEFORE THE PROCEDURE   Ask your health care provider about:   Changing or stopping your regular medicines. This is especially important if you are taking diabetes medicines or blood thinners.   Taking medicines such as aspirin and ibuprofen. These medicines can thin your blood. Do not take these medicines before your procedure if your health care provider instructs you not to.   Do not eat or drink anything after midnight on the night before the procedure or as directed by your health care provider.   You may be given instructions for clearing out your bowel before surgery (bowel prep). If you are already in the hospital, the  bowel prep may be done there.  PROCEDURE   An IV tube may be inserted into a vein. You may receive fluids and medicine through the IV tube. This may include antibiotic medicine to treat or prevent infection.   You will be given a medicine that makes you go to sleep (general anesthetic).   You may have a tube placed through your nose and into your stomach (nasogastric tube) to drain your stomach fluids.   You may have a tube placed into your bladder (urinary catheter) to drain urine.   Your abdomen will be cleaned with a germ-killing solution (antiseptic).   The surgeon will make a surgical cut (incision) in your abdomen. This is usually an up-and-down incision in the midsection of your abdomen. The incision will go through the inside lining of your abdomen (peritoneum).   Your surgeon will spread the incision wide enough to examine the inside of your abdomen.   The rest of the procedure will depend on what the surgeon finds:    The surgeon will check all organs in your abdomen for damage or obstruction. Repairs will be made when possible.    If there is blood in the abdomen, the surgeon will look for the source of the bleeding in order to stop it.    If there is yellowish-white fluid (pus) or gastric fluids in your abdomen, the surgeon will check for an infection or a hole (perforation) in your digestive tract.    If the surgeon finds infection, a drain may be   When all procedures are complete, the surgeon will close your abdomen with layers of stitches (sutures).  The incision through the skin of your abdomen will be closed with sutures or staples. AFTER THE PROCEDURE  Your blood pressure, heart rate, breathing rate, and blood oxygen level will be monitored often until the medicines you were  given have worn off.  You will continue to receive fluids and nutrition through your IV tube. This will stop when you can eat and drink on your own.  You may also get antibiotic medicine and pain medicine through your IV tube.  Your nasogastric tube may be removed when you start to pass gas.  Your urinary catheter may be removed when the anesthetic wears off.   This information is not intended to replace advice given to you by your health care provider. Make sure you discuss any questions you have with your health care provider.   Document Released: 09/02/2001 Document Revised: 12/29/2014 Document Reviewed: 07/26/2014 Elsevier Interactive Patient Education 2016 [employer]     INCREASE WATER INTAKE NEXT 48HOURS.   HAD A PERCOCET PAIN PILL AT 4:25PM ON WEDNESDAYDISCHARGE INSTRUCTIONS: Laparoscopy  The following instructions have been prepared to help you care for yourself upon your return home today.  Wound care:  Do not get the incision wet for the first 24 hours. The incision should be kept clean and dry.  The Band-Aids or dressings may be removed the day after surgery.  Should the incision become sore, red, and swollen after the first week, check with your doctor.  Personal hygiene:  Shower the day after your procedure.  Activity and limitations:  Do NOT drive or operate any equipment today.  Do NOT lift anything more than 15 pounds for 2-3 weeks after surgery.  Do NOT rest in bed all day.  Walking is encouraged. Walk each day, starting slowly with 5-minute walks 3 or 4 times a day. Slowly increase the length of your walks.  Walk up and down stairs slowly.  Do NOT do strenuous activities, such as golfing, playing tennis, bowling, running, biking, weight lifting, gardening, mowing, or vacuuming for 2-4 weeks. Ask your doctor when it is okay to start.  Diet: Eat a light meal as desired this evening. You may resume your usual diet tomorrow.  Return to work: This is  dependent on the type of work you do. For the most part you can return to a desk job within a week of surgery. If you are more active at work, please discuss this with your doctor.  What to expect after your surgery: You may have a slight burning sensation when you urinate on the first day. You may have a very small amount of blood in the urine. Expect to have a small amount of vaginal discharge/light bleeding for 1-2 weeks. It is not unusual to have abdominal soreness and bruising for up to 2 weeks. You may be tired and need more rest for about 1 week. You may experience shoulder pain for 24-72 hours. Lying flat in bed may relieve it.  Call your doctor for any of the following:  Develop a fever of 100.4 or greater  Inability to urinate 6 hours after discharge from hospital  Severe pain not relieved by pain medications  Persistent of heavy bleeding at incision site  Redness or swelling around incision site after a week  Increasing nausea or vomiting  Patient Signature________________________________________ Nurse Signature_________________________________________

## 2015-12-12 NOTE — Anesthesia Preprocedure Evaluation (Signed)
Anesthesia Evaluation  Patient identified by MRN, date of birth, ID band Patient awake    Reviewed: Allergy & Precautions, H&P , NPO status , Patient's Chart, lab work & pertinent test results  Airway Mallampati: I  TM Distance: >3 FB Neck ROM: full    Dental no notable dental hx.    Pulmonary Current Smoker,    Pulmonary exam normal        Cardiovascular negative cardio ROS Normal cardiovascular exam     Neuro/Psych negative neurological ROS  negative psych ROS   GI/Hepatic Neg liver ROS,   Endo/Other  negative endocrine ROS  Renal/GU negative Renal ROS     Musculoskeletal   Abdominal Normal abdominal exam  (+)   Peds  Hematology negative hematology ROS (+)   Anesthesia Other Findings   Reproductive/Obstetrics (+) Pregnancy                             Anesthesia Physical Anesthesia Plan  ASA: II  Anesthesia Plan: Spinal   Post-op Pain Management:    Induction:   Airway Management Planned:   Additional Equipment:   Intra-op Plan:   Post-operative Plan:   Informed Consent: I have reviewed the patients History and Physical, chart, labs and discussed the procedure including the risks, benefits and alternatives for the proposed anesthesia with the patient or authorized representative who has indicated his/her understanding and acceptance.     Plan Discussed with: Surgeon and CRNA  Anesthesia Plan Comments:         Anesthesia Quick Evaluation

## 2015-12-12 NOTE — Anesthesia Procedure Notes (Signed)
Spinal Patient location during procedure: OR Start time: 12/12/2015 1:48 PM End time: 12/12/2015 1:51 PM Staffing Anesthesiologist: Leilani Able Performed by: anesthesiologist  Preanesthetic Checklist Completed: patient identified, surgical consent, pre-op evaluation, timeout performed, IV checked, risks and benefits discussed and monitors and equipment checked Spinal Block Patient position: sitting Prep: site prepped and draped and DuraPrep Patient monitoring: heart rate, cardiac monitor, continuous pulse ox and blood pressure Approach: midline Location: L3-4 Injection technique: single-shot Needle Needle type: Pencan  Needle gauge: 24 G Needle length: 9 cm Needle insertion depth: 5 cm Assessment Sensory level: T8

## 2015-12-12 NOTE — Transfer of Care (Signed)
Immediate Anesthesia Transfer of Care Note  Patient: Mary Lowe  Procedure(s) Performed: Procedure(s): HERNIA REPAIR UMBILICAL ADULT (N/A)  Patient Location: PACU  Anesthesia Type:Spinal  Level of Consciousness: awake, alert , oriented and patient cooperative  Airway & Oxygen Therapy: Patient Spontanous Breathing and Patient connected to nasal cannula oxygen  Post-op Assessment: Report given to RN and Post -op Vital signs reviewed and stable  Post vital signs: Reviewed and stable  Last Vitals:  Filed Vitals:   12/12/15 1224  BP: 116/78  Pulse: 96  Temp: 36.7 C  Resp: 20    Complications: No apparent anesthesia complications

## 2015-12-13 ENCOUNTER — Telehealth: Payer: Self-pay | Admitting: Advanced Practice Midwife

## 2015-12-13 ENCOUNTER — Encounter (HOSPITAL_COMMUNITY): Payer: Self-pay | Admitting: Obstetrics & Gynecology

## 2015-12-13 NOTE — Telephone Encounter (Signed)
Pt called stating that she spoke with fran regarding a belt that is good for her to use. Pt would like that Belt. Please contact pt

## 2015-12-13 NOTE — Telephone Encounter (Signed)
Pt requesting Rx for "abdominal binder" sent to store in [place].

## 2015-12-14 ENCOUNTER — Telehealth: Payer: Self-pay | Admitting: Obstetrics & Gynecology

## 2015-12-14 MED ORDER — TRAMADOL HCL 50 MG PO TABS: 50.0000 mg | ORAL_TABLET | Freq: Three times a day (TID) | ORAL | Status: DC | PRN

## 2015-12-14 NOTE — Telephone Encounter (Signed)
Pt states the Oxycodone is causing her to have itching all over. Pt tried just taking Tylenol but it isn't sufficient to relieve the pain. Pt states is there an alternative?

## 2015-12-18 ENCOUNTER — Telehealth: Payer: Self-pay | Admitting: *Deleted

## 2015-12-18 NOTE — Telephone Encounter (Signed)
Pt states the Ultram is not relieving her pain. Pt inform Dr.Eure out of the office today.

## 2015-12-18 NOTE — Telephone Encounter (Signed)
Pt informed message routed to Dr. Despina Hidden waiting his response.

## 2015-12-19 NOTE — Telephone Encounter (Signed)
Please tell patient to take 2 extra strength tylenol with her dose of ultram  Additionally she really should be using and requiring much less pain meds at this point.    Dont really have anything else reasonable to go to since she has itching with oxycodone, she will do the same with hydrocodone and dilaudid is way too strong

## 2015-12-19 NOTE — Telephone Encounter (Signed)
Pt informed per Dr.Eure to take 2 extra strength tylenol with her dose of Ultram for pain relief. Pt verbalized understanding.

## 2015-12-20 ENCOUNTER — Encounter: Payer: Medicaid Other | Admitting: Obstetrics and Gynecology

## 2015-12-20 ENCOUNTER — Telehealth: Payer: Self-pay | Admitting: *Deleted

## 2015-12-21 ENCOUNTER — Telehealth: Payer: Self-pay | Admitting: Obstetrics & Gynecology

## 2015-12-21 MED ORDER — TRAMADOL HCL 50 MG PO TABS: 50.0000 mg | ORAL_TABLET | Freq: Three times a day (TID) | ORAL | Status: DC | PRN

## 2015-12-23 NOTE — L&D Delivery Note (Signed)
  Delivery Note At 11:41 AM a viable and healthy female was delivered via Vaginal, Spontaneous Delivery (Presentation: Right Occiput Anterior).  APGAR: 9, 10; weight pending .   Placenta status: Intact, Spontaneous.  Cord: 3 vessels with the following complications: None.   Anesthesia: Epidural  Episiotomy: None Lacerations: None, intact Suture Repair: n/a Est. Blood Loss (mL): 200  Mom to postpartum.  Baby to Couplet care / Skin to Skin.  Mary Lowe is a 35 y.o. female [phone] with IUP at [redacted]w[redacted]d admitted for postdates IOL.  She progressed with Foley bulb, Cytotec, then AROM to complete and pushed less than 15 minutes to deliver.  Cord clamping delayed by several minutes then clamped by CNM and cut by FOB.  Placenta intact and spontaneous, bleeding minimal.  Intact perineum.  Mom and baby stable prior to transfer to postpartum. She plans on breastfeeding. She is undecided about contraception at this time.   LEFTWICH-KIRBY, Omnia Dollinger 03/01/2016, 12:59 PM

## 2015-12-26 ENCOUNTER — Ambulatory Visit (INDEPENDENT_AMBULATORY_CARE_PROVIDER_SITE_OTHER): Payer: Medicaid Other | Admitting: Obstetrics and Gynecology

## 2015-12-26 ENCOUNTER — Encounter: Payer: Self-pay | Admitting: Obstetrics and Gynecology

## 2015-12-26 VITALS — BP 122/84 | HR 106 | Wt 168.0 lb

## 2015-12-26 DIAGNOSIS — Z3493 Encounter for supervision of normal pregnancy, unspecified, third trimester: Secondary | ICD-10-CM

## 2015-12-26 DIAGNOSIS — N390 Urinary tract infection, site not specified: Secondary | ICD-10-CM

## 2015-12-26 DIAGNOSIS — Z09 Encounter for follow-up examination after completed treatment for conditions other than malignant neoplasm: Secondary | ICD-10-CM

## 2015-12-26 DIAGNOSIS — Z1389 Encounter for screening for other disorder: Secondary | ICD-10-CM

## 2015-12-26 DIAGNOSIS — Z331 Pregnant state, incidental: Secondary | ICD-10-CM

## 2015-12-26 LAB — POCT URINALYSIS DIPSTICK
Blood, UA: NEGATIVE
Glucose, UA: NEGATIVE
KETONES UA: NEGATIVE
Leukocytes, UA: NEGATIVE
PROTEIN UA: NEGATIVE

## 2015-12-26 MED ORDER — NITROFURANTOIN MONOHYD MACRO 100 MG PO CAPS: 100.0000 mg | ORAL_CAPSULE | Freq: Two times a day (BID) | ORAL | Status: DC

## 2015-12-26 MED ORDER — TRAMADOL HCL 50 MG PO TABS: 50.0000 mg | ORAL_TABLET | Freq: Three times a day (TID) | ORAL | Status: DC | PRN

## 2015-12-26 NOTE — Progress Notes (Signed)
High Risk Pregnancy Diagnosis(es):   S/p surgery for herniorraphy  [id number] [redacted]w[redacted]d Estimated Date of Delivery: 02/23/16     HPI: The patient is being seen today for ongoing management of postop pain s/p hernia repair. Pt with gradual iimprovement since surgery. Today she reports pain at 7/10 Patient reports good fetal movement, denies any bleeding and no rupture of membranes symptoms or regular contractions.   BP weight and urine results all reviewed and noted. Blood pressure 122/84, pulse 106, weight 168 lb (76.204 kg), last menstrual period 05/10/2015.  Fetal Surveillance Testing today:  none Fundal Height:  29 Fetal Heart rate:  144 Edema:  neg Urinalysis: Negativefor protein, pos for nitrites   Questions were answered.  Lab and sonogram results have been reviewed. Comments: normal   Assessment:  1.  Pregnancy at [redacted]w[redacted]d,  Estimated Date of Delivery: 02/23/16 :  stable                        2.  Healing well s/p umbilical herniorraphy, residual abd wall pain                        3.  No evidence of PTL  Medication(s) Plans:  Tramadol x 40 tabs tid x 2wk ,                                    Macrodantin 100 bid x 7d.  Treatment Plan:  tx for pain and cover uti , due to + u/a  Follow up in 2 weeks for appointment for high risk OB care, followup of surgery in pregnancy

## 2015-12-26 NOTE — Progress Notes (Signed)
Pt states that she is still really sore and that she has had some cramping since her surgery.

## 2015-12-28 LAB — URINE CULTURE

## 2016-01-02 ENCOUNTER — Telehealth: Payer: Self-pay | Admitting: Obstetrics and Gynecology

## 2016-01-02 MED ORDER — AMOXICILLIN-POT CLAVULANATE 875-125 MG PO TABS: 1.0000 | ORAL_TABLET | Freq: Two times a day (BID) | ORAL | Status: DC

## 2016-01-02 NOTE — Telephone Encounter (Signed)
I spoke with Dr. Emelda Fear and he advised the pt try an OTC TENS unit. I will call the pt and inform the pt of this. Pt aware of Dr. Rayna Sexton recommendations. Pt verbalized understanding.

## 2016-01-02 NOTE — Telephone Encounter (Signed)
uti meds changed due to sensitivites

## 2016-01-02 NOTE — Telephone Encounter (Signed)
Spoke with pt and she stated that she is using the belly band and the rolled up towels as advised and is still not getting any relief. Pt states that she has been taking the Ultram and 2 tylenol together and she only has relief for about 2-3 hours. Pt states that she has a burning sensation and can not get comfortable at all. Pt states that she can NOT take percocet. Pt wants to know if there is something else she can try to help relieve the pain? Pt's next appointment is next week.  I advised the pt that I would talk with Dr. Emelda Fear about this and see what he wants to do. Pt verbalized understanding.

## 2016-01-08 NOTE — Telephone Encounter (Signed)
Pt seen

## 2016-01-09 ENCOUNTER — Ambulatory Visit (INDEPENDENT_AMBULATORY_CARE_PROVIDER_SITE_OTHER): Payer: Medicaid Other | Admitting: Obstetrics and Gynecology

## 2016-01-09 ENCOUNTER — Encounter: Payer: Medicaid Other | Admitting: Women's Health

## 2016-01-09 ENCOUNTER — Telehealth: Payer: Self-pay | Admitting: *Deleted

## 2016-01-09 VITALS — BP 120/76 | HR 107 | Wt 170.0 lb

## 2016-01-09 DIAGNOSIS — Z1389 Encounter for screening for other disorder: Secondary | ICD-10-CM

## 2016-01-09 DIAGNOSIS — Z09 Encounter for follow-up examination after completed treatment for conditions other than malignant neoplasm: Secondary | ICD-10-CM

## 2016-01-09 DIAGNOSIS — N390 Urinary tract infection, site not specified: Secondary | ICD-10-CM

## 2016-01-09 DIAGNOSIS — O2343 Unspecified infection of urinary tract in pregnancy, third trimester: Secondary | ICD-10-CM

## 2016-01-09 DIAGNOSIS — Z331 Pregnant state, incidental: Secondary | ICD-10-CM

## 2016-01-09 LAB — POCT URINALYSIS DIPSTICK
Glucose, UA: NEGATIVE
Ketones, UA: NEGATIVE
Nitrite, UA: POSITIVE

## 2016-01-09 MED ORDER — NITROFURANTOIN MONOHYD MACRO 100 MG PO CAPS: 100.0000 mg | ORAL_CAPSULE | Freq: Two times a day (BID) | ORAL | Status: DC

## 2016-01-09 MED ORDER — AMOXICILLIN-POT CLAVULANATE 875-125 MG PO TABS: 1.0000 | ORAL_TABLET | Freq: Two times a day (BID) | ORAL | Status: AC

## 2016-01-09 MED ORDER — TRAMADOL HCL 50 MG PO TABS: 50.0000 mg | ORAL_TABLET | Freq: Four times a day (QID) | ORAL | Status: DC | PRN

## 2016-01-09 NOTE — Telephone Encounter (Signed)
Dr.Ferguson advised for the pt to get the Augmentin from the pharmacy and take. Pt verbalized understanding.

## 2016-01-09 NOTE — Progress Notes (Signed)
Pt denies any problems or concerns at this time.  

## 2016-01-09 NOTE — Patient Instructions (Signed)
Please get the Rx for the Augmentin that was called in to the pharmacy earlier.

## 2016-01-09 NOTE — Progress Notes (Signed)
[id number] [redacted]w[redacted]d Estimated Date of Delivery: 02/23/16 LROB visit.  Blood pressure 120/76, pulse 107, weight 170 lb (77.111 kg), last menstrual period 05/10/2015.   refer to the ob flow sheet for FH and FHR, also BP, Wt, Urine results:notable for + uti persisits.  Patient reports  Still has uti on todays u/a. Pt apparently did NOT get the Augmentin Rx called in when Klebsiella uti dx'd last week.   good fetal movement, denies any bleeding and no rupture of membranes symptoms or regular contractions. Patient complaints: didn't get Rx , still has uti..  Questions were answered. Assessment: LROB [id number] @ [redacted]w[redacted]d persistend K pneumonia UTI with intermediate sensitivity to Macrobid, pt did not get the Rx for augmentin  Plan:  Continued routine obstetrical care, pt to get her Augmnentin Rx filled  F/u in 2 weeks for pnx visit , proof of cure.

## 2016-01-09 NOTE — Addendum Note (Signed)
Addended by: Tilda Burrow on: 01/09/2016 01:42 PM   Modules accepted: Orders

## 2016-01-09 NOTE — Telephone Encounter (Signed)
Pt concerned about getting the same antibiotic as last week. I advised the pt that I would speak with Dr. Emelda Fear when he comes back in the office and give her a call back. Pt verbalized understanding.

## 2016-01-11 LAB — URINE CULTURE

## 2016-01-16 ENCOUNTER — Other Ambulatory Visit: Payer: Self-pay | Admitting: Advanced Practice Midwife

## 2016-01-16 NOTE — Op Note (Signed)
Preoperative diagnosis:  29 weeks and 4 day gestational age, EDD 02/23/2016                                          Incisional Umbilical hernia with incarcerated omentum                                           Severe uncontrollable, peri Umbilical pain  Postoperative diagnosis: Same as above  Procedure:  Incisional Umbilical hernia repair with resection of incarcerated fat and omentum  Surgeon: Lazaro Arms, MD  Findings:  Patient has had an abrupt onset of umbilical pain, specifically just to the right of the umbilicus, over the past week but significantly worse over the last 2-3 days.  She had a laparoscopy in the past for an ectopic pregnancy. An MRI reveals a 17 mm defect with incarcerated fat.  She was evaluated by general surgeon who did not feel the patient needed surgery at this time since there was no incarcerated bowel.  However I don't think that we can manage her pain as an outpatient.  As I said in my note of admission I tried to reduce the incarcerated fat in the hernia for about 15 minutes without success and came to the conclusion a could not be managed any other way.  Intraoperatively she has omentum and fat up in the hernia sac with asked her congestion and dusky change consistent with incarceration and vascular compromise which is the source of her pain.  Description of operation: Patient was taken to operating room placed in the sitting position where she underwent a spinal anesthetic. When adequate levels obtained she was placed in supine position and prepped and draped in usual sterile fashion.  An elliptical incision was made to the right of the umbilicus from 10:00 to 4:00.  The dissection was taken down to the subcutaneous fat and the fascial defect was noted with peritoneal fat being present as well as omentum with incarceration causing vascular congestion and compromise of the blood supply.  I completely resected all of the omentum and fat that was involved and  dissected out the hernia sac and total removing all of the affected tissue.  I was somewhat surprised at how large the hernia sac was for such a small defect.  Interrupted Vicryl sutures were placed for hemostasis and all pedicles were found to be hemostatic.  Fascia was closed with interrupted PDS.  Excess tissue was reapproximated as well and the skin was closed using 4-0 Vicryl in a subcuticular fashion.  Liquiban was placed.  X per L was injected. Patient tolerated well she expressed maternal blood loss taken covering good stable condition was out WERE correct and she received Ancef prophylactically  Lazaro Arms, MD

## 2016-01-23 ENCOUNTER — Other Ambulatory Visit: Payer: Self-pay | Admitting: Obstetrics and Gynecology

## 2016-01-23 ENCOUNTER — Encounter: Payer: Medicaid Other | Admitting: Women's Health

## 2016-01-23 DIAGNOSIS — Z09 Encounter for follow-up examination after completed treatment for conditions other than malignant neoplasm: Secondary | ICD-10-CM

## 2016-01-23 MED ORDER — TRAMADOL HCL 50 MG PO TABS: 50.0000 mg | ORAL_TABLET | Freq: Four times a day (QID) | ORAL | Status: DC | PRN

## 2016-01-28 ENCOUNTER — Ambulatory Visit (INDEPENDENT_AMBULATORY_CARE_PROVIDER_SITE_OTHER): Payer: Medicaid Other | Admitting: Obstetrics and Gynecology

## 2016-01-28 ENCOUNTER — Encounter: Payer: Self-pay | Admitting: Obstetrics and Gynecology

## 2016-01-28 VITALS — BP 130/86 | HR 109 | Wt 177.0 lb

## 2016-01-28 DIAGNOSIS — Z3483 Encounter for supervision of other normal pregnancy, third trimester: Secondary | ICD-10-CM

## 2016-01-28 DIAGNOSIS — Z369 Encounter for antenatal screening, unspecified: Secondary | ICD-10-CM

## 2016-01-28 DIAGNOSIS — Z3A37 37 weeks gestation of pregnancy: Secondary | ICD-10-CM

## 2016-01-28 DIAGNOSIS — Z1389 Encounter for screening for other disorder: Secondary | ICD-10-CM

## 2016-01-28 DIAGNOSIS — Z331 Pregnant state, incidental: Secondary | ICD-10-CM

## 2016-01-28 DIAGNOSIS — O321XX0 Maternal care for breech presentation, not applicable or unspecified: Secondary | ICD-10-CM

## 2016-01-28 DIAGNOSIS — Z3493 Encounter for supervision of normal pregnancy, unspecified, third trimester: Secondary | ICD-10-CM

## 2016-01-28 LAB — POCT URINALYSIS DIPSTICK
Blood, UA: NEGATIVE
GLUCOSE UA: NEGATIVE
Ketones, UA: NEGATIVE
LEUKOCYTES UA: NEGATIVE
NITRITE UA: NEGATIVE
Protein, UA: NEGATIVE

## 2016-01-28 NOTE — Progress Notes (Signed)
Patient ID: JENNIFERLYNN HODGDON, female   DOB: [birth year], 35 y.o.   MRN: [id number] [redacted]w[redacted]d Estimated Date of Delivery: 02/23/16  Blood pressure 130/86, pulse 109, weight 177 lb (80.287 kg), last menstrual period 05/10/2015.   refer to the ob flow sheet for FH and FHR, also BP, Wt, Urine results: negative  FHR: 150 FH: 37cm  Patient reports  + good fetal movement, denies any bleeding and no rupture of membranes symptoms or regular contractions. Patient complaints: Pt complains of generalized swelling including swelling o BLE and bilateral hands.   Questions were answered. Assessment:  1. LROB [id number] @ [redacted]w[redacted]d  2. Miseries of pregnancy 3. Breech with body to maternal right side  For ECV  Plan:   1. Continued routine obstetrical care  2. Group B streptococcus screening  3. F/u in 2 weeks for continued routine OB care.  4. ECV on 01/04/16.    By signing my name below, I, Marica Otter, attest that this documentation has been prepared under the direction and in the presence of Christin Bach, MD. Electronically Signed: Marica Otter, ED Scribe. 01/28/2016. 12:12 PM.  I personally performed the services described in this documentation, which was SCRIBED in my presence. The recorded information has been reviewed and considered accurate. It has been edited as necessary during review. Tilda Burrow, MD   .

## 2016-01-29 ENCOUNTER — Telehealth (HOSPITAL_COMMUNITY): Payer: Self-pay | Admitting: *Deleted

## 2016-01-29 NOTE — Telephone Encounter (Signed)
Preadmission screen  

## 2016-01-30 LAB — STREP GP B NAA: Strep Gp B NAA: POSITIVE — AB

## 2016-01-30 LAB — GC/CHLAMYDIA PROBE AMP
CHLAMYDIA, DNA PROBE: NEGATIVE
NEISSERIA GONORRHOEAE BY PCR: NEGATIVE

## 2016-02-04 ENCOUNTER — Inpatient Hospital Stay (HOSPITAL_COMMUNITY)
Admission: RE | Admit: 2016-02-04 | Discharge: 2016-02-04 | DRG: 781 | Disposition: A | Discharge status: Home / Self Care | Payer: Medicaid Other | Source: Ambulatory Visit | Attending: Obstetrics & Gynecology | Admitting: Obstetrics & Gynecology

## 2016-02-04 ENCOUNTER — Other Ambulatory Visit: Payer: Self-pay | Admitting: Obstetrics and Gynecology

## 2016-02-04 ENCOUNTER — Encounter (HOSPITAL_COMMUNITY): Payer: Self-pay

## 2016-02-04 ENCOUNTER — Inpatient Hospital Stay (HOSPITAL_COMMUNITY): Payer: Medicaid Other

## 2016-02-04 ENCOUNTER — Telehealth: Payer: Self-pay | Admitting: Obstetrics and Gynecology

## 2016-02-04 DIAGNOSIS — Z3A38 38 weeks gestation of pregnancy: Secondary | ICD-10-CM | POA: Diagnosis not present

## 2016-02-04 DIAGNOSIS — Z3A37 37 weeks gestation of pregnancy: Secondary | ICD-10-CM | POA: Diagnosis not present

## 2016-02-04 DIAGNOSIS — O321XX0 Maternal care for breech presentation, not applicable or unspecified: Secondary | ICD-10-CM

## 2016-02-04 DIAGNOSIS — O283 Abnormal ultrasonic finding on antenatal screening of mother: Secondary | ICD-10-CM | POA: Diagnosis present

## 2016-02-04 DIAGNOSIS — Z09 Encounter for follow-up examination after completed treatment for conditions other than malignant neoplasm: Secondary | ICD-10-CM

## 2016-02-04 DIAGNOSIS — O26893 Other specified pregnancy related conditions, third trimester: Secondary | ICD-10-CM | POA: Diagnosis present

## 2016-02-04 DIAGNOSIS — Z3493 Encounter for supervision of normal pregnancy, unspecified, third trimester: Secondary | ICD-10-CM

## 2016-02-04 DIAGNOSIS — O288 Other abnormal findings on antenatal screening of mother: Secondary | ICD-10-CM

## 2016-02-04 DIAGNOSIS — Z62810 Personal history of physical and sexual abuse in childhood: Secondary | ICD-10-CM

## 2016-02-04 MED ORDER — TRAMADOL HCL 50 MG PO TABS: 50.0000 mg | ORAL_TABLET | Freq: Four times a day (QID) | ORAL | Status: DC | PRN

## 2016-02-04 NOTE — Telephone Encounter (Signed)
Pt states that Dr. Emelda Fear told her that he would send in a refill on her tramodol. I advised the pt that she would get a call from me when it has been done. Pt verbalized understanding.

## 2016-02-04 NOTE — Progress Notes (Signed)
Patient ID: Mary Lowe, female   DOB: Jul 29, 1981, 35 y.o.   MRN: [id number] Subjective:  Mary Lowe is a 35 y.o. G7 P3 female at 31 and 2/[redacted] weeks gestation who is being admitted for external version.but upon arrival is found to be vertex, havning converted since last week's office visit.Marland Kitchen  Her current obstetrical history is significant for umbilical hernia that required repair in the pregnancy, The prolene suture is just beneat the skin and the tip of the suture is sensitive , to be addressed after delivery.  Patient reports no complaints.   Fetal Movement: normal.     Objective:   Vital signs in last 24 hours: Temp:  [98.3 F (36.8 C)] 98.3 F (36.8 C) (02/13 0717) Pulse Rate:  [102] 102 (02/13 0727) BP: (117)/(85) 117/85 mmHg (02/13 0727) Weight:  [80.287 kg (177 lb)] 80.287 kg (177 lb) (02/13 0729)   General:   alert and cooperative  Skin:   normal  HEENT:  PERRLA  Lungs:   clear to auscultation bilaterally  Heart:   regular rate and rhythm  Breasts:     Abdomen:  gravid uterus,c/w dates. Prolene suture tip noted just below Hernioraphy  Pelvis:  Exam deferred.  FHT:  150 BPM  Uterine Size: size equals dates  Presentations: cephalic by u/s by me  Cervix:    Dilation:    Effacement:    Station:     Consistency:    Position:    Lab Review    Assessment/Plan:  35 and 2/[redacted] weeks gestation. Not in labor. Obstetrical history significant for aresolved breech presentation .      Not in labor, discharge home. will refil tramadol rx for incision pain after herniorraphy

## 2016-02-04 NOTE — Telephone Encounter (Signed)
rx available Friday. 02/08/16

## 2016-02-11 ENCOUNTER — Encounter: Payer: Medicaid Other | Admitting: Obstetrics & Gynecology

## 2016-02-12 ENCOUNTER — Ambulatory Visit (INDEPENDENT_AMBULATORY_CARE_PROVIDER_SITE_OTHER): Payer: Medicaid Other | Admitting: Obstetrics and Gynecology

## 2016-02-12 ENCOUNTER — Encounter: Payer: Self-pay | Admitting: Obstetrics and Gynecology

## 2016-02-12 VITALS — BP 122/70 | HR 104 | Wt 177.0 lb

## 2016-02-12 DIAGNOSIS — Z3493 Encounter for supervision of normal pregnancy, unspecified, third trimester: Secondary | ICD-10-CM

## 2016-02-12 DIAGNOSIS — Z331 Pregnant state, incidental: Secondary | ICD-10-CM

## 2016-02-12 DIAGNOSIS — Z1389 Encounter for screening for other disorder: Secondary | ICD-10-CM

## 2016-02-12 LAB — POCT URINALYSIS DIPSTICK
GLUCOSE UA: NEGATIVE
Ketones, UA: NEGATIVE
LEUKOCYTES UA: NEGATIVE
NITRITE UA: POSITIVE
Protein, UA: NEGATIVE
RBC UA: NEGATIVE

## 2016-02-12 NOTE — Progress Notes (Signed)
Pt states that "wire" is poking out.

## 2016-02-12 NOTE — Progress Notes (Signed)
[id number] [redacted]w[redacted]d Estimated Date of Delivery: 02/23/16  Pt MISERABLE" WITH  Pressure sx.  Incision from herniorraphy,  Blood pressure 122/70, pulse 104, weight 177 lb (80.287 kg), last menstrual period 05/10/2015.   refer to the ob flow sheet for FH and FHR, also BP, Wt, Urine results:notable for neg ;protein  Patient reports   good fetal movement, denies any bleeding and no rupture of membranes symptoms or regular contractions. Patient complaints:pelvic pressure,  Stitch from 0 prolene umbilical incision repair had protruded thru skin, trimmed back 1/4 in removed..  Questions were answered. Assessment: LROB [id number] @ [redacted]w[redacted]d , pelvic pressure of pregnancy  Plan:  Continued routine obstetrical care, BC: Unsure.  F/u in 1  weeks for pnx

## 2016-02-13 ENCOUNTER — Telehealth: Payer: Self-pay | Admitting: *Deleted

## 2016-02-14 MED ORDER — NITROFURANTOIN MONOHYD MACRO 100 MG PO CAPS: 100.0000 mg | ORAL_CAPSULE | Freq: Two times a day (BID) | ORAL | Status: DC

## 2016-02-14 NOTE — Telephone Encounter (Signed)
Med sent to [facility] for uti

## 2016-02-19 ENCOUNTER — Ambulatory Visit (INDEPENDENT_AMBULATORY_CARE_PROVIDER_SITE_OTHER): Payer: Medicaid Other | Admitting: Obstetrics and Gynecology

## 2016-02-19 ENCOUNTER — Encounter: Payer: Self-pay | Admitting: Obstetrics and Gynecology

## 2016-02-19 VITALS — BP 120/76 | HR 106 | Wt 179.0 lb

## 2016-02-19 DIAGNOSIS — Z1389 Encounter for screening for other disorder: Secondary | ICD-10-CM

## 2016-02-19 DIAGNOSIS — Z3493 Encounter for supervision of normal pregnancy, unspecified, third trimester: Secondary | ICD-10-CM

## 2016-02-19 DIAGNOSIS — Z09 Encounter for follow-up examination after completed treatment for conditions other than malignant neoplasm: Secondary | ICD-10-CM

## 2016-02-19 DIAGNOSIS — Z331 Pregnant state, incidental: Secondary | ICD-10-CM

## 2016-02-19 LAB — POCT URINALYSIS DIPSTICK
GLUCOSE UA: NEGATIVE
Ketones, UA: NEGATIVE
Leukocytes, UA: NEGATIVE
NITRITE UA: NEGATIVE
PROTEIN UA: NEGATIVE
RBC UA: NEGATIVE

## 2016-02-19 MED ORDER — ZOLPIDEM TARTRATE 5 MG PO TABS: 5.0000 mg | ORAL_TABLET | Freq: Every evening | ORAL | Status: DC | PRN

## 2016-02-19 NOTE — Progress Notes (Signed)
[id number] [redacted]w[redacted]d LROB Estimated Date of Delivery: 02/23/16  Pt complains of back pain, umbilical pain, generally miserable. Similar to prior pregs where pt claims she was induced.  Cervix " never cooperates."  Blood pressure 120/76, pulse 106, weight 179 lb (81.194 kg), last menstrual period 05/10/2015.   refer to the ob flow sheet for FH and FHR, also BP, Wt, Urine results:notable for neg protein  Patient reports   good fetal movement, denies any bleeding and no rupture of membranes symptoms or regular contractions. Patient complaints:as above. No rom or bleeding.  Questions were answered. Assessment: LROB [id number] @ [redacted]w[redacted]d , LROB, extensive discomforts of pregnancy.  Plan:  Continued routine obstetrical care, check cervix weekly,   F/u in 1  weeks for cx check.

## 2016-02-19 NOTE — Progress Notes (Signed)
Pt states that she is having lots of back pain, vomiting since Friday and hurts.

## 2016-02-26 ENCOUNTER — Ambulatory Visit (INDEPENDENT_AMBULATORY_CARE_PROVIDER_SITE_OTHER): Payer: Medicaid Other | Admitting: Obstetrics and Gynecology

## 2016-02-26 ENCOUNTER — Encounter: Payer: Self-pay | Admitting: Obstetrics and Gynecology

## 2016-02-26 ENCOUNTER — Telehealth (HOSPITAL_COMMUNITY): Payer: Self-pay | Admitting: *Deleted

## 2016-02-26 VITALS — BP 120/80 | HR 114 | Wt 175.0 lb

## 2016-02-26 DIAGNOSIS — Z3A41 41 weeks gestation of pregnancy: Secondary | ICD-10-CM | POA: Diagnosis not present

## 2016-02-26 DIAGNOSIS — O480 Post-term pregnancy: Secondary | ICD-10-CM | POA: Diagnosis not present

## 2016-02-26 DIAGNOSIS — Z1389 Encounter for screening for other disorder: Secondary | ICD-10-CM

## 2016-02-26 DIAGNOSIS — Z3493 Encounter for supervision of normal pregnancy, unspecified, third trimester: Secondary | ICD-10-CM

## 2016-02-26 DIAGNOSIS — Z331 Pregnant state, incidental: Secondary | ICD-10-CM

## 2016-02-26 LAB — POCT URINALYSIS DIPSTICK
GLUCOSE UA: NEGATIVE
KETONES UA: NEGATIVE
Nitrite, UA: POSITIVE

## 2016-02-26 NOTE — Progress Notes (Signed)
[id number] [redacted]w[redacted]d Estimated Date of Delivery: 02/23/16 LROB,  Blood pressure 120/80, pulse 114, weight 175 lb (79.379 kg), last menstrual period 05/10/2015.   refer to the ob flow sheet for FH and FHR, also BP, Wt, Urine results:notable for neg protein  Patient reports   good fetal movement, denies any bleeding and no rupture of membranes symptoms or regular contractions. Patient complaints:pt continues to have lots of abd discomfort around site of unblical herniorrhaphy..  Questions were answered. Assessment: LROB [id number] @ [redacted]w[redacted]d  Impending postdates pregnancy  Plan:  Continued routine obstetrical care, IOL set up for midnight  Friday night, may need cytotec first for cervical ripening.  F/u in 3 days for  Admission at midnight for Cytotec cervical ripening and then probable foley bulb, then pitocin

## 2016-02-26 NOTE — Telephone Encounter (Signed)
Preadmission screen  

## 2016-02-26 NOTE — Progress Notes (Signed)
Pt denies any problems or concerns at this time.  

## 2016-02-26 NOTE — Patient Instructions (Signed)
Induction Friday night, at midnight , for problable delivery on Saturday (41 wk)

## 2016-02-27 ENCOUNTER — Other Ambulatory Visit: Payer: Self-pay | Admitting: Advanced Practice Midwife

## 2016-03-01 ENCOUNTER — Inpatient Hospital Stay (HOSPITAL_COMMUNITY): Payer: Medicaid Other | Admitting: Anesthesiology

## 2016-03-01 ENCOUNTER — Encounter (HOSPITAL_COMMUNITY): Payer: Self-pay | Admitting: *Deleted

## 2016-03-01 ENCOUNTER — Inpatient Hospital Stay (HOSPITAL_COMMUNITY)
Admission: RE | Admit: 2016-03-01 | Payer: Medicaid Other | Source: Ambulatory Visit | Attending: Obstetrics and Gynecology | Admitting: Obstetrics and Gynecology

## 2016-03-01 ENCOUNTER — Inpatient Hospital Stay (HOSPITAL_COMMUNITY)
Admit: 2016-03-01 | Discharge: 2016-03-02 | DRG: 775 | Disposition: A | Discharge status: Home / Self Care | Payer: Medicaid Other | Source: Ambulatory Visit | Attending: Obstetrics & Gynecology | Admitting: Obstetrics & Gynecology

## 2016-03-01 DIAGNOSIS — Z8249 Family history of ischemic heart disease and other diseases of the circulatory system: Secondary | ICD-10-CM | POA: Diagnosis not present

## 2016-03-01 DIAGNOSIS — O480 Post-term pregnancy: Principal | ICD-10-CM | POA: Diagnosis present

## 2016-03-01 DIAGNOSIS — Z3A41 41 weeks gestation of pregnancy: Secondary | ICD-10-CM

## 2016-03-01 DIAGNOSIS — O99824 Streptococcus B carrier state complicating childbirth: Secondary | ICD-10-CM | POA: Diagnosis present

## 2016-03-01 DIAGNOSIS — Z3490 Encounter for supervision of normal pregnancy, unspecified, unspecified trimester: Secondary | ICD-10-CM

## 2016-03-01 DIAGNOSIS — O99334 Smoking (tobacco) complicating childbirth: Secondary | ICD-10-CM | POA: Diagnosis present

## 2016-03-01 DIAGNOSIS — F17210 Nicotine dependence, cigarettes, uncomplicated: Secondary | ICD-10-CM | POA: Diagnosis present

## 2016-03-01 DIAGNOSIS — Z6711 Type A blood, Rh negative: Secondary | ICD-10-CM

## 2016-03-01 DIAGNOSIS — O26893 Other specified pregnancy related conditions, third trimester: Secondary | ICD-10-CM | POA: Diagnosis present

## 2016-03-01 DIAGNOSIS — Z23 Encounter for immunization: Secondary | ICD-10-CM

## 2016-03-01 HISTORY — DX: Anxiety disorder, unspecified: F41.9

## 2016-03-01 HISTORY — DX: Other specified postprocedural states: Z98.890

## 2016-03-01 HISTORY — DX: Personal history of other diseases of the digestive system: Z87.19

## 2016-03-01 HISTORY — DX: Depression, unspecified: F32.A

## 2016-03-01 HISTORY — DX: Major depressive disorder, single episode, unspecified: F32.9

## 2016-03-01 HISTORY — DX: Personal history of physical and sexual abuse in childhood: Z62.810

## 2016-03-01 HISTORY — DX: Nicotine dependence, unspecified, uncomplicated: F17.200

## 2016-03-01 LAB — TYPE AND SCREEN
ABO/RH(D): A NEG
Antibody Screen: NEGATIVE

## 2016-03-01 LAB — CBC
HEMATOCRIT: 33.6 % — AB (ref 36.0–46.0)
HEMOGLOBIN: 11.5 g/dL — AB (ref 12.0–15.0)
MCH: 29.4 pg (ref 26.0–34.0)
MCHC: 34.2 g/dL (ref 30.0–36.0)
MCV: 85.9 fL (ref 78.0–100.0)
Platelets: 208 10*3/uL (ref 150–400)
RBC: 3.91 MIL/uL (ref 3.87–5.11)
RDW: 14.5 % (ref 11.5–15.5)
WBC: 16.5 10*3/uL — ABNORMAL HIGH (ref 4.0–10.5)

## 2016-03-01 LAB — RPR: RPR: NONREACTIVE

## 2016-03-01 MED ORDER — LIDOCAINE HCL (PF) 1 % IJ SOLN
INTRAMUSCULAR | Status: DC | PRN
  Administered 2016-03-01: 3 mL via EPIDURAL
  Administered 2016-03-01: 2 mL
  Administered 2016-03-01: 5 mL via EPIDURAL

## 2016-03-01 MED ORDER — EPHEDRINE 5 MG/ML INJ
10.0000 mg | INTRAVENOUS | Status: DC | PRN
  Filled 2016-03-01: qty 2

## 2016-03-01 MED ORDER — ACETAMINOPHEN 325 MG PO TABS
650.0000 mg | ORAL_TABLET | ORAL | Status: DC | PRN
  Administered 2016-03-01: 650 mg via ORAL
  Filled 2016-03-01: qty 2

## 2016-03-01 MED ORDER — ONDANSETRON HCL 4 MG/2ML IJ SOLN
4.0000 mg | Freq: Four times a day (QID) | INTRAMUSCULAR | Status: DC | PRN
  Administered 2016-03-01: 4 mg via INTRAVENOUS
  Filled 2016-03-01: qty 2

## 2016-03-01 MED ORDER — PHENYLEPHRINE 40 MCG/ML (10ML) SYRINGE FOR IV PUSH (FOR BLOOD PRESSURE SUPPORT)
80.0000 ug | PREFILLED_SYRINGE | INTRAVENOUS | Status: DC | PRN
  Filled 2016-03-01: qty 2
  Filled 2016-03-01: qty 20

## 2016-03-01 MED ORDER — DIPHENHYDRAMINE HCL 25 MG PO CAPS: 25.0000 mg | ORAL_CAPSULE | Freq: Four times a day (QID) | ORAL | Status: DC | PRN

## 2016-03-01 MED ORDER — LACTATED RINGERS IV SOLN: 500.0000 mL | INTRAVENOUS | Status: DC | PRN

## 2016-03-01 MED ORDER — TETANUS-DIPHTH-ACELL PERTUSSIS 5-2.5-18.5 LF-MCG/0.5 IM SUSP
0.5000 mL | Freq: Once | INTRAMUSCULAR | Status: AC
  Administered 2016-03-02: 0.5 mL via INTRAMUSCULAR
  Filled 2016-03-01: qty 0.5

## 2016-03-01 MED ORDER — ZOLPIDEM TARTRATE 5 MG PO TABS: 5.0000 mg | ORAL_TABLET | Freq: Every evening | ORAL | Status: DC | PRN

## 2016-03-01 MED ORDER — PENICILLIN G POTASSIUM 5000000 UNITS IJ SOLR
2.5000 10*6.[IU] | INTRAVENOUS | Status: DC
  Administered 2016-03-01 (×2): 2.5 10*6.[IU] via INTRAVENOUS
  Filled 2016-03-01 (×6): qty 2.5

## 2016-03-01 MED ORDER — DIPHENHYDRAMINE HCL 50 MG/ML IJ SOLN: 12.5000 mg | INTRAMUSCULAR | Status: DC | PRN

## 2016-03-01 MED ORDER — BENZOCAINE-MENTHOL 20-0.5 % EX AERO
1.0000 "application " | INHALATION_SPRAY | CUTANEOUS | Status: DC | PRN
  Administered 2016-03-01: 1 via TOPICAL
  Filled 2016-03-01: qty 56

## 2016-03-01 MED ORDER — PENICILLIN G POTASSIUM 5000000 UNITS IJ SOLR
5.0000 10*6.[IU] | Freq: Once | INTRAMUSCULAR | Status: AC
  Administered 2016-03-01: 5 10*6.[IU] via INTRAVENOUS
  Filled 2016-03-01: qty 5

## 2016-03-01 MED ORDER — WITCH HAZEL-GLYCERIN EX PADS
1.0000 | MEDICATED_PAD | CUTANEOUS | Status: DC | PRN
Start: 2016-03-01 — End: 2016-03-02

## 2016-03-01 MED ORDER — OXYCODONE-ACETAMINOPHEN 5-325 MG PO TABS
2.0000 | ORAL_TABLET | ORAL | Status: DC | PRN
  Administered 2016-03-01: 2 via ORAL
  Administered 2016-03-02 (×2): 1 via ORAL
  Administered 2016-03-02: 2 via ORAL
  Filled 2016-03-01 (×4): qty 2

## 2016-03-01 MED ORDER — LIDOCAINE HCL (PF) 1 % IJ SOLN
30.0000 mL | INTRAMUSCULAR | Status: DC | PRN
  Filled 2016-03-01: qty 30

## 2016-03-01 MED ORDER — LACTATED RINGERS IV SOLN
500.0000 mL | Freq: Once | INTRAVENOUS | Status: DC
Start: 2016-03-01 — End: 2016-03-01

## 2016-03-01 MED ORDER — FENTANYL 2.5 MCG/ML BUPIVACAINE 1/10 % EPIDURAL INFUSION (WH - ANES)
14.0000 mL/h | INTRAMUSCULAR | Status: DC | PRN
  Administered 2016-03-01 (×3): 14 mL/h via EPIDURAL
  Filled 2016-03-01 (×2): qty 125

## 2016-03-01 MED ORDER — ONDANSETRON HCL 4 MG PO TABS
4.0000 mg | ORAL_TABLET | ORAL | Status: DC | PRN
  Administered 2016-03-02: 4 mg via ORAL
  Filled 2016-03-01: qty 1

## 2016-03-01 MED ORDER — LACTATED RINGERS IV SOLN
500.0000 mL | Freq: Once | INTRAVENOUS | Status: AC
  Administered 2016-03-01: 500 mL via INTRAVENOUS

## 2016-03-01 MED ORDER — LANOLIN HYDROUS EX OINT: TOPICAL_OINTMENT | CUTANEOUS | Status: DC | PRN

## 2016-03-01 MED ORDER — LACTATED RINGERS IV SOLN
INTRAVENOUS | Status: DC
  Administered 2016-03-01 (×2): via INTRAVENOUS

## 2016-03-01 MED ORDER — OXYCODONE-ACETAMINOPHEN 5-325 MG PO TABS: 2.0000 | ORAL_TABLET | ORAL | Status: DC | PRN

## 2016-03-01 MED ORDER — FENTANYL CITRATE (PF) 100 MCG/2ML IJ SOLN
100.0000 ug | INTRAMUSCULAR | Status: DC | PRN
  Administered 2016-03-01 (×2): 100 ug via INTRAVENOUS
  Filled 2016-03-01 (×2): qty 2

## 2016-03-01 MED ORDER — IBUPROFEN 600 MG PO TABS
600.0000 mg | ORAL_TABLET | Freq: Four times a day (QID) | ORAL | Status: DC
  Administered 2016-03-01 – 2016-03-02 (×5): 600 mg via ORAL
  Filled 2016-03-01 (×5): qty 1

## 2016-03-01 MED ORDER — TERBUTALINE SULFATE 1 MG/ML IJ SOLN
0.2500 mg | Freq: Once | INTRAMUSCULAR | Status: DC | PRN
  Filled 2016-03-01: qty 1

## 2016-03-01 MED ORDER — OXYCODONE-ACETAMINOPHEN 5-325 MG PO TABS: 1.0000 | ORAL_TABLET | ORAL | Status: DC | PRN

## 2016-03-01 MED ORDER — PHENYLEPHRINE 40 MCG/ML (10ML) SYRINGE FOR IV PUSH (FOR BLOOD PRESSURE SUPPORT)
80.0000 ug | PREFILLED_SYRINGE | INTRAVENOUS | Status: DC | PRN
  Filled 2016-03-01: qty 2

## 2016-03-01 MED ORDER — MISOPROSTOL 25 MCG QUARTER TABLET
25.0000 ug | ORAL_TABLET | ORAL | Status: DC | PRN
  Administered 2016-03-01: 25 ug via VAGINAL
  Filled 2016-03-01: qty 1
  Filled 2016-03-01: qty 0.25

## 2016-03-01 MED ORDER — OXYCODONE HCL 5 MG PO TABS
10.0000 mg | ORAL_TABLET | Freq: Once | ORAL | Status: AC
  Administered 2016-03-01: 10 mg via ORAL
  Filled 2016-03-01: qty 2

## 2016-03-01 MED ORDER — CITRIC ACID-SODIUM CITRATE 334-500 MG/5ML PO SOLN: 30.0000 mL | ORAL | Status: DC | PRN

## 2016-03-01 MED ORDER — PNEUMOCOCCAL VAC POLYVALENT 25 MCG/0.5ML IJ INJ
0.5000 mL | INJECTION | INTRAMUSCULAR | Status: AC
  Administered 2016-03-02: 0.5 mL via INTRAMUSCULAR
  Filled 2016-03-01: qty 0.5

## 2016-03-01 MED ORDER — OXYTOCIN 10 UNIT/ML IJ SOLN
2.5000 [IU]/h | INTRAVENOUS | Status: DC
  Filled 2016-03-01: qty 10

## 2016-03-01 MED ORDER — DIBUCAINE 1 % RE OINT: 1.0000 "application " | TOPICAL_OINTMENT | RECTAL | Status: DC | PRN

## 2016-03-01 MED ORDER — SENNOSIDES-DOCUSATE SODIUM 8.6-50 MG PO TABS
2.0000 | ORAL_TABLET | ORAL | Status: DC
  Administered 2016-03-02: 2 via ORAL
  Filled 2016-03-01: qty 2

## 2016-03-01 MED ORDER — SIMETHICONE 80 MG PO CHEW: 80.0000 mg | CHEWABLE_TABLET | ORAL | Status: DC | PRN

## 2016-03-01 MED ORDER — ONDANSETRON HCL 4 MG/2ML IJ SOLN: 4.0000 mg | INTRAMUSCULAR | Status: DC | PRN

## 2016-03-01 MED ORDER — PRENATAL MULTIVITAMIN CH
1.0000 | ORAL_TABLET | Freq: Every day | ORAL | Status: DC
  Administered 2016-03-01 – 2016-03-02 (×2): 1 via ORAL
  Filled 2016-03-01 (×2): qty 1

## 2016-03-01 MED ORDER — FLEET ENEMA 7-19 GM/118ML RE ENEM: 1.0000 | ENEMA | RECTAL | Status: DC | PRN

## 2016-03-01 MED ORDER — ACETAMINOPHEN 325 MG PO TABS: 650.0000 mg | ORAL_TABLET | ORAL | Status: DC | PRN

## 2016-03-01 MED ORDER — ALBUTEROL SULFATE (2.5 MG/3ML) 0.083% IN NEBU: 3.0000 mL | INHALATION_SOLUTION | Freq: Every day | RESPIRATORY_TRACT | Status: DC | PRN

## 2016-03-01 MED ORDER — OXYTOCIN BOLUS FROM INFUSION
500.0000 mL | INTRAVENOUS | Status: DC
  Administered 2016-03-01: 500 mL via INTRAVENOUS

## 2016-03-01 NOTE — Lactation Note (Signed)
This note was copied from a baby's chart. Lactation Consultation Note  Patient Name: Mary Lowe [id number] Date: 03/01/2016 Reason for consult: Initial assessment Baby at 7 hr of life, experienced bf mom reports feedings are going well. Her oldest was lactose intolerant so she stopped bf. Her other children she had to go back to work and had trouble pumping enough. Denies breast or nipple pain, no concerns voiced. Demonstrated manual expression, colostrum noted bilaterally, spoon in room. Discussed baby behavior, feeding frequency, baby belly size, voids, wt loss, breast changes, and nipple care. Given lactation handouts. Aware of OP services and support group. She plans to f/u with [facility] on 03/03/16 and has a DEBP at home.       Maternal Data Has patient been taught Hand Expression?: Yes Does the patient have breastfeeding experience prior to this delivery?: Yes  Feeding Feeding Type: Breast Fed Length of feed: 30 min  LATCH Score/Interventions Latch: Repeated attempts needed to sustain latch, nipple held in mouth throughout feeding, stimulation needed to elicit sucking reflex.  Audible Swallowing: Spontaneous and intermittent  Type of Nipple: Everted at rest and after stimulation  Comfort (Breast/Nipple): Soft / non-tender     Hold (Positioning): Assistance needed to correctly position infant at breast and maintain latch.  LATCH Score: 8  Lactation Tools Discussed/Used WIC Program: Yes   Consult Status Consult Status: Follow-up Date: 03/02/16 Follow-up type: In-patient    Rulon Eisenmenger 03/01/2016, 7:20 PM

## 2016-03-01 NOTE — Anesthesia Preprocedure Evaluation (Addendum)
Anesthesia Evaluation  Patient identified by MRN, date of birth, ID band Patient awake    Reviewed: Allergy & Precautions, NPO status , Patient's Chart, lab work & pertinent test results  History of Anesthesia Complications (+) PONV and history of anesthetic complications  Airway Mallampati: II  TM Distance: >3 FB Neck ROM: Full    Dental  (+) Teeth Intact, Dental Advisory Given   Pulmonary Current Smoker,    Pulmonary exam normal breath sounds clear to auscultation       Cardiovascular Exercise Tolerance: Good negative cardio ROS Normal cardiovascular exam Rhythm:Regular Rate:Normal     Neuro/Psych negative neurological ROS  negative psych ROS   GI/Hepatic GERD  ,(+)     substance abuse  marijuana use, IBS, Crohn's disease   Endo/Other  Obesity   Renal/GU negative Renal ROS     Musculoskeletal negative musculoskeletal ROS (+)   Abdominal   Peds  Hematology  (+) Blood dyscrasia, anemia , Plt 208k   Anesthesia Other Findings Day of surgery medications reviewed with the patient.  Reproductive/Obstetrics (+) Pregnancy 35 y.o. female [phone] with IUP at [redacted]w[redacted]d by U/S presenting for IOL for post dates                            Anesthesia Physical Anesthesia Plan  ASA: II  Anesthesia Plan: Epidural   Post-op Pain Management:    Induction:   Airway Management Planned:   Additional Equipment:   Intra-op Plan:   Post-operative Plan:   Informed Consent: I have reviewed the patients History and Physical, chart, labs and discussed the procedure including the risks, benefits and alternatives for the proposed anesthesia with the patient or authorized representative who has indicated his/her understanding and acceptance.   Dental advisory given  Plan Discussed with:   Anesthesia Plan Comments: (Patient identified. Risks/Benefits/Options discussed with patient including but not  limited to bleeding, infection, nerve damage, paralysis, failed block, incomplete pain control, headache, blood pressure changes, nausea, vomiting, reactions to medication both or allergic, itching and postpartum back pain. Confirmed with bedside nurse the patient's most recent platelet count. Confirmed with patient that they are not currently taking any anticoagulation, have any bleeding history or any family history of bleeding disorders. Patient expressed understanding and wished to proceed. All questions were answered. )        Anesthesia Quick Evaluation

## 2016-03-01 NOTE — Anesthesia Procedure Notes (Signed)
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
## 2016-03-01 NOTE — Progress Notes (Signed)
Mary Lowe is a 35 y.o. [phone] at [redacted]w[redacted]d by ultrasound admitted for induction of labor due to Post dates. Due date 02/23/16.  Subjective: Patient feeling some pressure but is much more comfortable after epidural. Foley bulb fell out around 0500. Not feeling very many contractions.   Objective: BP 123/85 mmHg  Pulse 89  Temp(Src) 98 F (36.7 C) (Oral)  Resp 18  Ht 5' 2.5" (1.588 m)  Wt 79.379 kg (175 lb)  BMI 31.48 kg/m2  SpO2 96%  LMP 05/10/2015      FHT:  FHR: 140 bpm, variability: moderate,  accelerations:  Present,  decelerations:  Present 1 variable around 0700 UC:   regular, every 1.5-2 minutes SVE:   Dilation: 5 Effacement (%): 70 Station: -2 Exam by:: [email or web address], rnc   Labs: Lab Results  Component Value Date   WBC 16.5* 03/01/2016   HGB 11.5* 03/01/2016   HCT 33.6* 03/01/2016   MCV 85.9 03/01/2016   PLT 208 03/01/2016    Assessment / Plan: Induction of labor due to postterm,  progressing well   Labor: Progressing normally Fetal Wellbeing:  Category I Pain Control:  Epidural I/D:  GBS pos Anticipated MOD:  NSVD  Beaulah Dinning 03/01/2016, 7:33 AM

## 2016-03-02 MED ORDER — IBUPROFEN 600 MG PO TABS: 600.0000 mg | ORAL_TABLET | Freq: Four times a day (QID) | ORAL | Status: DC

## 2016-03-02 MED ORDER — OXYCODONE-ACETAMINOPHEN 5-325 MG PO TABS: 1.0000 | ORAL_TABLET | ORAL | Status: DC | PRN

## 2016-03-02 MED ORDER — BENZOCAINE-MENTHOL 20-0.5 % EX AERO: 1.0000 "application " | INHALATION_SPRAY | CUTANEOUS | Status: DC | PRN

## 2016-03-02 NOTE — Discharge Summary (Signed)
OB Discharge Summary     Patient Name: Mary Lowe DOB: 1981/06/06 MRN: [id number]  Date of admission: 03/01/2016 Delivering MD: Sharen Counter A   Date of discharge: 03/02/2016  Admitting diagnosis: 41 wks induction Intrauterine pregnancy: [redacted]w[redacted]d     Secondary diagnosis:  Active Problems:   Pregnancy   NSVD (normal spontaneous vaginal delivery)  Additional problems: none     Discharge diagnosis: Term Pregnancy Delivered                                                                                                Post partum procedures:none  Augmentation: Cytotec and Foley Balloon  Complications: None  Hospital course:  Induction of Labor With Vaginal Delivery   35 y.o. yo [id number] at [redacted]w[redacted]d was admitted to the hospital 03/01/2016 for induction of labor.  Indication for induction: Postdates.  Patient had an uncomplicated labor course as follows: Membrane Rupture Time/Date: 10:57 AM ,03/01/2016   Intrapartum Procedures: Episiotomy: None [1]                                         Lacerations:  None [1]  Patient had delivery of a Viable infant.  Information for the patient's newborn:  Kurkowski, Girl Jahayra [[id number]]  Delivery Method: Vaginal, Spontaneous Delivery (Filed from Delivery Summary)   03/01/2016  Details of delivery can be found in separate delivery note.  Patient had a routine postpartum course. Patient is discharged home 03/02/2016.   Physical exam  Filed Vitals:   03/01/16 1430 03/01/16 1529 03/01/16 1802 03/01/16 1945  BP: 133/84 128/85 132/79 130/72  Pulse: 92 90 87 80  Temp: 98.4 F (36.9 C) 98.9 F (37.2 C)  98.1 F (36.7 C)  TempSrc: Oral Oral  Oral  Resp: Height:      Weight:      SpO2: 99% 98% 99% 98%   General: alert, cooperative and no distress  C/o soreness in vagina, pain in sciatic joint and umbilicus at site of hernia repair. Lochia: appropriate Uterine Fundus: firm Incision: Healing well with no significant  drainage DVT Evaluation: No evidence of DVT seen on physical exam. Labs: Lab Results  Component Value Date   WBC 16.5* 03/01/2016   HGB 11.5* 03/01/2016   HCT 33.6* 03/01/2016   MCV 85.9 03/01/2016   PLT 208 03/01/2016   CMP Latest Ref Rng 12/10/2015  Glucose 65 - 99 mg/dL 81  BUN 6 - 20 mg/dL 6  Creatinine 4.74 - 2.59 mg/dL 5.63  Sodium 875 - 643 mmol/L 139  Potassium 3.5 - 5.1 mmol/L 3.8  Chloride 101 - 111 mmol/L 109  CO2 22 - 32 mmol/L 21(L)  Calcium 8.9 - 10.3 mg/dL 3.2(R)    Discharge instruction: per After Visit Summary and "Baby and Me Booklet".  After visit meds:    Medication List    TAKE these medications        benzocaine-Menthol 20-0.5 % Aero  Commonly known as:  DERMOPLAST  Apply  1 application topically as needed for irritation (perineal discomfort).     calcium carbonate 500 MG chewable tablet  Commonly known as:  TUMS - dosed in mg elemental calcium  Chew 1 tablet by mouth daily.     ibuprofen 600 MG tablet  Commonly known as:  ADVIL,MOTRIN  Take 1 tablet (600 mg total) by mouth every 6 (six) hours.     oxyCODONE-acetaminophen 5-325 MG tablet  Commonly known as:  PERCOCET/ROXICET  Take 1 tablet by mouth every 4 (four) hours as needed for moderate pain.     PROAIR HFA 108 (90 Base) MCG/ACT inhaler  Generic drug:  albuterol  Inhale 2 puffs into the lungs daily as needed for wheezing or shortness of breath. Reported on 12/11/2015     promethazine 25 MG tablet  Commonly known as:  PHENERGAN  TAKE 1 TABLET (25 MG TOTAL) BY MOUTH EVERY 6 (SIX) HOURS AS NEEDED FOR NAUSEA OR VOMITING.        Diet: routine diet  Activity: Advance as tolerated. Pelvic rest for 6 weeks.   Outpatient follow up:4 weeks Follow up Appt:Future Appointments Date Time Provider Department Center  03/25/2016 11:00 AM Jacklyn Shell, CNM [facility]   Follow up Visit:No Follow-up on file.  Postpartum contraception: Undecided  Newborn Data: Live born  female  Birth Weight: 8 lb 3.2 oz (3720 g) APGAR: 9, 10  Baby Feeding: Breast Disposition:home with mother   03/02/2016 Wynelle Bourgeois, CNM

## 2016-03-02 NOTE — Anesthesia Postprocedure Evaluation (Signed)
Anesthesia Post Note  Patient: Mary Lowe  Procedure(s) Performed: * No procedures listed *  Patient location during evaluation: Mother Baby Anesthesia Type: Epidural Level of consciousness: awake, awake and alert and oriented Pain management: pain level controlled Vital Signs Assessment: post-procedure vital signs reviewed and stable Respiratory status: spontaneous breathing Cardiovascular status: stable Postop Assessment: no headache, no backache, adequate PO intake, no signs of nausea or vomiting and patient able to bend at knees Anesthetic complications: no    Last Vitals:  Filed Vitals:   03/01/16 1945 03/02/16 0645  BP: 130/72 126/76  Pulse: 80 87  Temp: 36.7 C 36.5 C  Resp: 20 19    Last Pain:  Filed Vitals:   03/02/16 0942  PainSc: 7                  Sonyia Muro Hristova

## 2016-03-02 NOTE — Clinical Social Work Maternal (Signed)
CLINICAL SOCIAL WORK MATERNAL/CHILD NOTE  Patient Details  Name: Mary Lowe MRN: [id number] Date of Birth: 06/01/1981  Date:  03/02/2016  Clinical Social Worker Initiating Note:  Janace Decker E. Deryck Hippler, LCSW Date/ Time Initiated:  03/02/16/1200     Child's Name:  Mary Lowe   Legal Guardian:   (Parents: Mary Lowe and Mary Lowe)   Need for Interpreter:  None   Date of Referral:  03/02/16     Reason for Referral:  Other (Comment), Current Substance Use/Substance Use During Pregnancy  (Hx of Dep/Anx.)   Referral Source:  [facility]   Address:  [address], [place], [place] [address]  Phone number:  [phone]   Household Members:  Spouse, Minor Children (MOB has joint custody of her children with their fathers.  She has 3 other children: Mary Lowe (2004), Mary Lowe (2006), and Mary Lowe (2013).  FOB has two other children: Mary Lowe (2005), and Mary Lowe (2012).  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  MOB's facesheet notes religion as Christian.  Parents report a strong faith that is important in their daily life.  Strengths:  Ability to meet basic needs , Compliance with medical plan , Pediatrician chosen , Home prepared for child  (Pediatric follow up will be at [facility].)   Risk Factors/Current Problems:  None   Cognitive State:  Alert , Able to Concentrate , Linear Thinking , Goal Oriented , Insightful    Mood/Affect:  Euthymic , Interested , Comfortable    CSW Assessment: CSW met with parents in MOB's first floor  room/108 to introduce services, offer support, and complete assessment due to hx of Dep/Anx and THC use.  Parents were pleasant and welcoming of CSW's visit.  CSW found both parents easy to engage and involved in the conversation.  FOB was holding baby on the couch and MOB had just finished taking a shower. MOB reports feeling well at this time and ready to go home.  She reports baby is doing well also, but that they have noticed her "sneezing a lot."  CSW encouraged her to tell the Pediatrician about this.  She agreed.  Parents note this as the only concern.  MOB talked about her labor and delivery story, stating frustration that she "did not know anyone."  As MOB is a patient at [facility], CSW explained that their providers rotate with the hospital's [facility].  MOB states her family and friends were good supports during this time and that she is happy that baby is here and well.   CSW inquired about how MOB felt during pregnancy, hx of PPD, and hx of Anx/Dep.  MOB reports that she cried at everything during pregnancy, but felt that this was normal and not a hindrance to her.  She states she had PPD after her second child, with increased feelings of sadness and inadequacy.  She reports, "I just knew something wasn't right."  She states she reported this to her doctor and was prescribed medication.  She states she felt well fairly soon after   symptoms began.  She reports no medication at this time and does not feel she needs treatment for any mental health concerns at this time.  She explained that there is "a lot of drama" with the father of her daughters and that they are going through a "custody battle."  MOB reports that she sees them only every other weekend.  She states this has caused a lot of stress for her, but once she decided to "let it go and give it to God," she has felt at peace with no emotional concerns.  She states the relationship with her son's father is cordial and that they have joint  custody.  MOB states commitment to talking with her doctor if concerns arise.  FOB discussed the custody arrangements of his other children also.  His older daughter lives in [place] with her mother and FOB states he picks her up on the weekends when she doesn't have other activities going on.  He reports that due to the relationship with the mother of his other daughter, he does not get to see her and states he does not know where they currently live.  Parents are grateful for baby and that they will not have to share her with anyone but each other within their home.   CSW inquired about MOB's positive UDS for THC in July of 2016.  MOB states she does not smoke marijuana, but is often around it.  CSW explained hospital drug screen policy and informed parents that baby's UDS is negative.  Parents state no concerns.  CSW informed MOB that she was positive for Opiates and Barbiturates in December, but notes that this was at the time of her hernia surgery.  MOB reports no drug use or medication other than what was given to her/prescribed at the time of her surgery.  Parents were understanding of drug screen policy. Parents report no questions, concerns or needs for CSW.  CSW congratulated them on the birth of their daughter and thanked them for talking with CSW.  CSW identifies no current concerns or barriers to discharge and will monitor results of umbilical cord tissue drug screen.      CSW Plan/Description:  Patient/Family Education , No Further Intervention Required/No Barriers to Discharge    Mary Russey Elizabeth, LCSW 03/02/2016, 1:41 PM 

## 2016-03-02 NOTE — Discharge Instructions (Signed)

## 2016-03-13 ENCOUNTER — Telehealth: Payer: Self-pay | Admitting: *Deleted

## 2016-03-13 NOTE — Telephone Encounter (Signed)
Mary Lowe, postpartum nurse called. Pt was seen today and BP was 122/100 in left arm and 118/98 in right arm. Pt complained of headache for 3 days, off and on. I spoke with Drenda Freeze, CNM and she advised to take Tylenol and schedule an appt to be seen Monday for BP check. Pt voiced understanding and call was transferred to front desk. JSY

## 2016-03-17 ENCOUNTER — Ambulatory Visit: Payer: Medicaid Other | Admitting: Obstetrics & Gynecology

## 2016-03-17 ENCOUNTER — Encounter: Payer: Self-pay | Admitting: Obstetrics & Gynecology

## 2016-03-25 ENCOUNTER — Ambulatory Visit: Payer: Medicaid Other | Admitting: Advanced Practice Midwife

## 2016-05-05 ENCOUNTER — Ambulatory Visit: Payer: Medicaid Other | Admitting: Women's Health

## 2016-05-05 ENCOUNTER — Encounter: Payer: Self-pay | Admitting: Women's Health

## 2016-06-11 ENCOUNTER — Emergency Department (HOSPITAL_COMMUNITY): Payer: Medicaid Other

## 2016-06-11 ENCOUNTER — Encounter (HOSPITAL_COMMUNITY): Payer: Self-pay | Admitting: Emergency Medicine

## 2016-06-11 ENCOUNTER — Other Ambulatory Visit: Payer: Self-pay

## 2016-06-11 ENCOUNTER — Emergency Department (HOSPITAL_COMMUNITY)
Admission: EM | Admit: 2016-06-11 | Discharge: 2016-06-11 | Disposition: A | Discharge status: Home / Self Care | Payer: Medicaid Other | Attending: Emergency Medicine | Admitting: Emergency Medicine

## 2016-06-11 DIAGNOSIS — F17210 Nicotine dependence, cigarettes, uncomplicated: Secondary | ICD-10-CM | POA: Insufficient documentation

## 2016-06-11 DIAGNOSIS — Z791 Long term (current) use of non-steroidal anti-inflammatories (NSAID): Secondary | ICD-10-CM | POA: Insufficient documentation

## 2016-06-11 DIAGNOSIS — R42 Dizziness and giddiness: Secondary | ICD-10-CM | POA: Diagnosis not present

## 2016-06-11 DIAGNOSIS — Z79899 Other long term (current) drug therapy: Secondary | ICD-10-CM | POA: Diagnosis not present

## 2016-06-11 DIAGNOSIS — F329 Major depressive disorder, single episode, unspecified: Secondary | ICD-10-CM | POA: Diagnosis not present

## 2016-06-11 DIAGNOSIS — R509 Fever, unspecified: Secondary | ICD-10-CM | POA: Insufficient documentation

## 2016-06-11 DIAGNOSIS — R112 Nausea with vomiting, unspecified: Secondary | ICD-10-CM | POA: Insufficient documentation

## 2016-06-11 DIAGNOSIS — R197 Diarrhea, unspecified: Secondary | ICD-10-CM | POA: Insufficient documentation

## 2016-06-11 DIAGNOSIS — R1013 Epigastric pain: Secondary | ICD-10-CM | POA: Diagnosis present

## 2016-06-11 LAB — URINALYSIS, ROUTINE W REFLEX MICROSCOPIC
BILIRUBIN URINE: NEGATIVE
Glucose, UA: NEGATIVE mg/dL
Hgb urine dipstick: NEGATIVE
KETONES UR: NEGATIVE mg/dL
LEUKOCYTES UA: NEGATIVE
Nitrite: POSITIVE — AB
PROTEIN: NEGATIVE mg/dL
Specific Gravity, Urine: 1.02 (ref 1.005–1.030)
pH: 5.5 (ref 5.0–8.0)

## 2016-06-11 LAB — I-STAT BETA HCG BLOOD, ED (MC, WL, AP ONLY): I-stat hCG, quantitative: <5 m[IU]/mL (ref <5)

## 2016-06-11 LAB — LIPASE, BLOOD: LIPASE: 46 U/L (ref 11–51)

## 2016-06-11 LAB — CBC
HCT: 43.6 % (ref 36.0–46.0)
Hemoglobin: 14.9 g/dL (ref 12.0–15.0)
MCH: 29.4 pg (ref 26.0–34.0)
MCHC: 34.2 g/dL (ref 30.0–36.0)
MCV: 86.2 fL (ref 78.0–100.0)
PLATELETS: 237 10*3/uL (ref 150–400)
RBC: 5.06 MIL/uL (ref 3.87–5.11)
RDW: 13.5 % (ref 11.5–15.5)
WBC: 10.1 10*3/uL (ref 4.0–10.5)

## 2016-06-11 LAB — COMPREHENSIVE METABOLIC PANEL
ALT: 17 U/L (ref 14–54)
AST: 14 U/L — AB (ref 15–41)
Albumin: 4 g/dL (ref 3.5–5.0)
Alkaline Phosphatase: 54 U/L (ref 38–126)
Anion gap: 5 (ref 5–15)
BILIRUBIN TOTAL: 0.8 mg/dL (ref 0.3–1.2)
BUN: 13 mg/dL (ref 6–20)
CHLORIDE: 105 mmol/L (ref 101–111)
CO2: 27 mmol/L (ref 22–32)
CREATININE: 0.75 mg/dL (ref 0.44–1.00)
Calcium: 9 mg/dL (ref 8.9–10.3)
GFR calc Af Amer: >60 mL/min (ref >60)
Glucose, Bld: 83 mg/dL (ref 65–99)
Potassium: 3.5 mmol/L (ref 3.5–5.1)
Sodium: 137 mmol/L (ref 135–145)
Total Protein: 6.5 g/dL (ref 6.5–8.1)

## 2016-06-11 LAB — URINE MICROSCOPIC-ADD ON: RBC / HPF: NONE SEEN RBC/hpf (ref 0–5)

## 2016-06-11 MED ORDER — MORPHINE SULFATE (PF) 4 MG/ML IV SOLN
4.0000 mg | Freq: Once | INTRAVENOUS | Status: AC
  Administered 2016-06-11: 4 mg via INTRAVENOUS

## 2016-06-11 MED ORDER — MORPHINE SULFATE (PF) 4 MG/ML IV SOLN
4.0000 mg | Freq: Once | INTRAVENOUS | Status: AC
  Administered 2016-06-11: 4 mg via INTRAVENOUS
  Filled 2016-06-11: qty 1

## 2016-06-11 MED ORDER — METOCLOPRAMIDE HCL 10 MG PO TABS: 10.0000 mg | ORAL_TABLET | Freq: Four times a day (QID) | ORAL | Status: DC | PRN

## 2016-06-11 MED ORDER — MORPHINE SULFATE (PF) 4 MG/ML IV SOLN
INTRAVENOUS | Status: AC
  Administered 2016-06-11: 4 mg
  Filled 2016-06-11: qty 1

## 2016-06-11 MED ORDER — HYDROCODONE-ACETAMINOPHEN 5-325 MG PO TABS: 1.0000 | ORAL_TABLET | Freq: Four times a day (QID) | ORAL | Status: DC | PRN

## 2016-06-11 MED ORDER — DIATRIZOATE MEGLUMINE & SODIUM 66-10 % PO SOLN
ORAL | Status: AC
  Filled 2016-06-11: qty 30

## 2016-06-11 MED ORDER — IOPAMIDOL (ISOVUE-300) INJECTION 61%
100.0000 mL | Freq: Once | INTRAVENOUS | Status: AC | PRN
  Administered 2016-06-11: 100 mL via INTRAVENOUS

## 2016-06-11 MED ORDER — METOCLOPRAMIDE HCL 5 MG/ML IJ SOLN
10.0000 mg | Freq: Once | INTRAMUSCULAR | Status: AC
  Administered 2016-06-11: 10 mg via INTRAVENOUS
  Filled 2016-06-11: qty 2

## 2016-06-11 MED ORDER — ONDANSETRON HCL 4 MG/2ML IJ SOLN
4.0000 mg | Freq: Once | INTRAMUSCULAR | Status: AC
Start: 2016-06-11 — End: 2016-06-11
  Administered 2016-06-11: 4 mg via INTRAVENOUS
  Filled 2016-06-11: qty 2
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
## 2016-06-11 NOTE — ED Notes (Addendum)
Per EMS, pt reports abd pain x2 days. Vomiting and dizziness intermittently for last several days. last took phenergan x5 hours ago. cbg 90.

## 2016-06-11 NOTE — Discharge Instructions (Signed)
Make sure that  you drink at least six 8 ounce glasses of water or Gatorade each day. Avoid milk or foods containing milk such as cheese or ice cream while having diarrhea. Take Imodium as directed for diarrhea. Try the medication prescribed as needed for nausea.  Take Tylenol for mild pain or the pain medicine prescribed for bad pain. Don't take Tylenol together with the pain medicine prescribed as the combination to be dangerous to your liver. Call Dr.Corrington to arrange to be seen in the office if you're not better in the next 48 hours. Return if your condition worsens for any reason.

## 2016-07-20 ENCOUNTER — Encounter (HOSPITAL_COMMUNITY): Payer: Self-pay | Admitting: Emergency Medicine

## 2016-07-20 ENCOUNTER — Emergency Department (HOSPITAL_COMMUNITY)
Admission: EM | Admit: 2016-07-20 | Discharge: 2016-07-20 | Disposition: A | Discharge status: Home / Self Care | Payer: Medicaid Other | Attending: Emergency Medicine | Admitting: Emergency Medicine

## 2016-07-20 ENCOUNTER — Emergency Department (HOSPITAL_COMMUNITY): Payer: Medicaid Other

## 2016-07-20 DIAGNOSIS — W228XXA Striking against or struck by other objects, initial encounter: Secondary | ICD-10-CM | POA: Diagnosis not present

## 2016-07-20 DIAGNOSIS — Y929 Unspecified place or not applicable: Secondary | ICD-10-CM | POA: Insufficient documentation

## 2016-07-20 DIAGNOSIS — S0083XA Contusion of other part of head, initial encounter: Secondary | ICD-10-CM

## 2016-07-20 DIAGNOSIS — S0591XA Unspecified injury of right eye and orbit, initial encounter: Secondary | ICD-10-CM | POA: Diagnosis present

## 2016-07-20 DIAGNOSIS — Y999 Unspecified external cause status: Secondary | ICD-10-CM | POA: Diagnosis not present

## 2016-07-20 DIAGNOSIS — Y9389 Activity, other specified: Secondary | ICD-10-CM | POA: Diagnosis not present

## 2016-07-20 DIAGNOSIS — F17210 Nicotine dependence, cigarettes, uncomplicated: Secondary | ICD-10-CM | POA: Insufficient documentation

## 2016-07-20 DIAGNOSIS — H538 Other visual disturbances: Secondary | ICD-10-CM

## 2016-07-20 DIAGNOSIS — R42 Dizziness and giddiness: Secondary | ICD-10-CM | POA: Insufficient documentation

## 2016-07-20 MED ORDER — TRAMADOL HCL 50 MG PO TABS: 50.0000 mg | ORAL_TABLET | Freq: Four times a day (QID) | ORAL | 0 refills | Status: AC | PRN

## 2016-07-20 MED ORDER — FLUORESCEIN SODIUM 1 MG OP STRP
1.0000 | ORAL_STRIP | Freq: Once | OPHTHALMIC | Status: AC
  Administered 2016-07-20: 1 via OPHTHALMIC
  Filled 2016-07-20: qty 1

## 2016-07-20 MED ORDER — FLUORESCEIN SODIUM 1 MG OP STRP
ORAL_STRIP | OPHTHALMIC | Status: AC
  Filled 2016-07-20: qty 1

## 2016-07-20 MED ORDER — TETRACAINE HCL 0.5 % OP SOLN
OPHTHALMIC | Status: AC
  Filled 2016-07-20: qty 4

## 2016-07-20 MED ORDER — TETRACAINE HCL 0.5 % OP SOLN
1.0000 [drp] | Freq: Once | OPHTHALMIC | Status: AC
  Administered 2016-07-20: 1 [drp] via OPHTHALMIC
  Filled 2016-07-20: qty 4
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
## 2016-07-20 NOTE — Discharge Instructions (Signed)
Xrays are normal - You need a formal eye exam by an opthoalmologist in next 2 days  Please obtain all of your results from medical records or have your doctors office obtain the results - share them with your doctor - you should be seen at your doctors office in the next 2 days. Call today to arrange your follow up. Take the medications as prescribed. Please review all of the medicines and only take them if you do not have an allergy to them. Please be aware that if you are taking birth control pills, taking other prescriptions, ESPECIALLY ANTIBIOTICS may make the birth control ineffective - if this is the case, either do not engage in sexual activity or use alternative methods of birth control such as condoms until you have finished the medicine and your family doctor says it is OK to restart them. If you are on a blood thinner such as COUMADIN, be aware that any other medicine that you take may cause the coumadin to either work too much, or not enough - you should have your coumadin level rechecked in next 7 days if this is the case.  ?  It is also a possibility that you have an allergic reaction to any of the medicines that you have been prescribed - Everybody reacts differently to medications and while MOST people have no trouble with most medicines, you may have a reaction such as nausea, vomiting, rash, swelling, shortness of breath. If this is the case, please stop taking the medicine immediately and contact your physician.  ?  You should return to the ER if you develop severe or worsening symptoms.

## 2016-07-20 NOTE — ED Notes (Signed)
Pt states that she threw up, EDP made aware
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
## 2016-08-15 IMAGING — MR MR ABDOMEN W/O CM
4 of 9 series · 19 of 48 positions shown · non-contrast
Comparison: Abdominal wall ultrasound dated 12/10/2015

CLINICAL DATA: Pregnant, umbilical hernia

EXAM:
MRI ABDOMEN WITHOUT CONTRAST
TECHNIQUE: Multiplanar multisequence MR imaging was performed without the
administration of intravenous contrast.

[Series 3: T2 · axial · 3.0mm · 0.70mm/px · z∈[-199,+197]mm · 7 of 100 slices shown (1 of 2)]
[im 1/100]
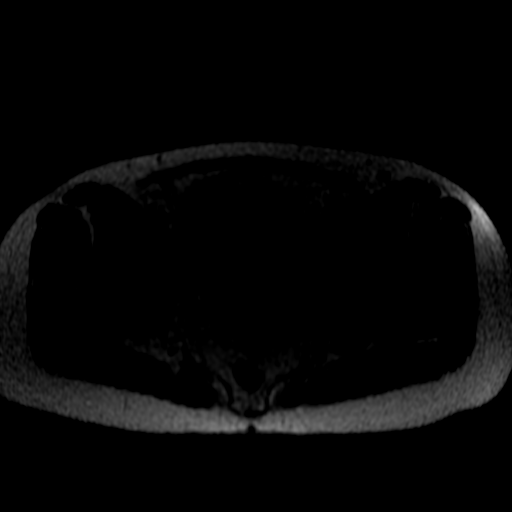
[im 17/100]
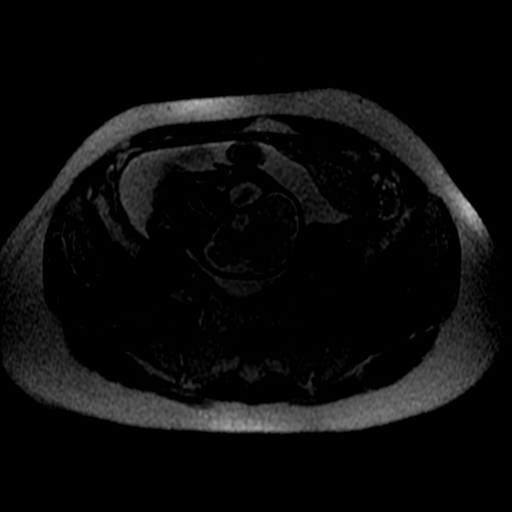
[im 34/100]
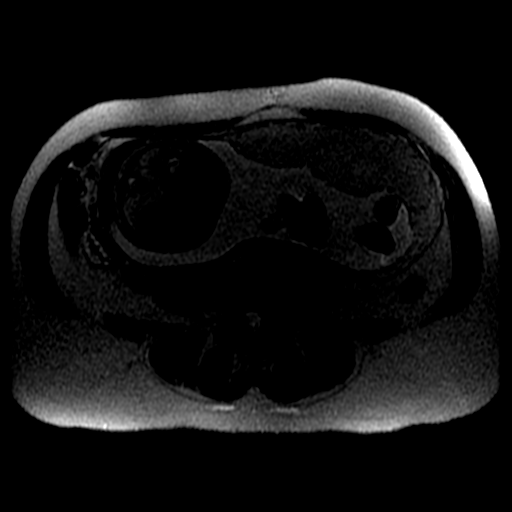
[im 50/100]
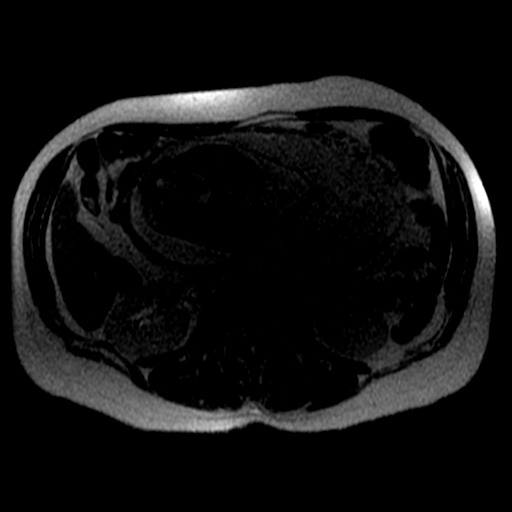
[im 67/100]
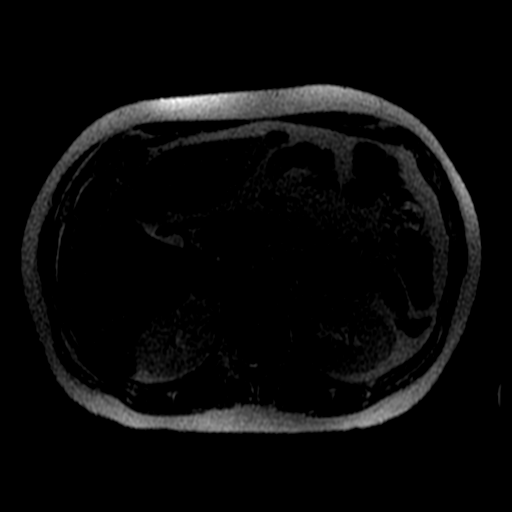
[im 83/100]
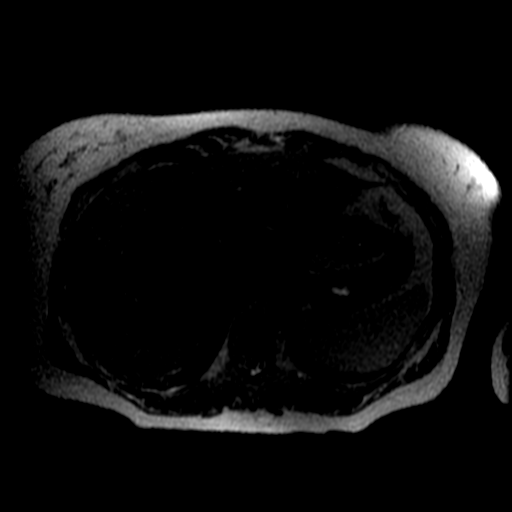
[im 100/100]
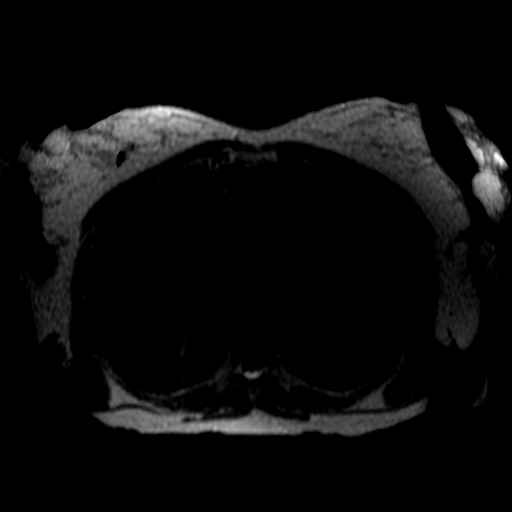

[Series 4: bSSFP · axial · 5.0mm · 0.74mm/px · z∈[-194,+176]mm · 5 of 75 slices shown (1 of 2)]
[im 1/75]
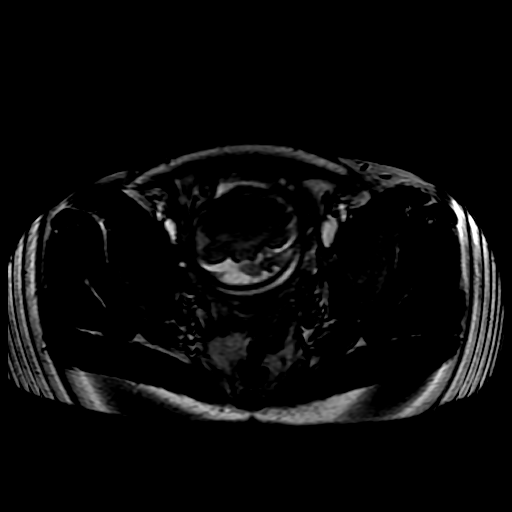
[im 19/75]
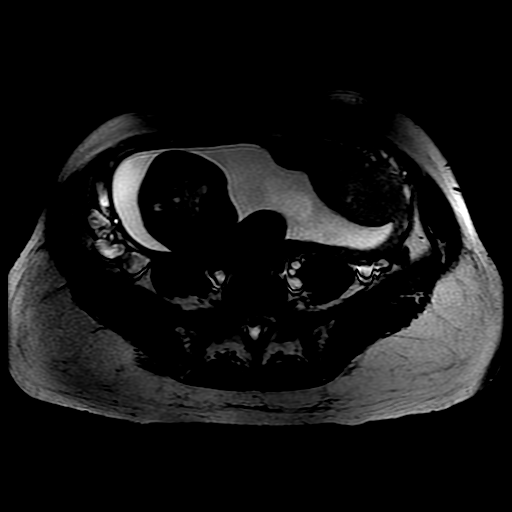
[im 38/75]
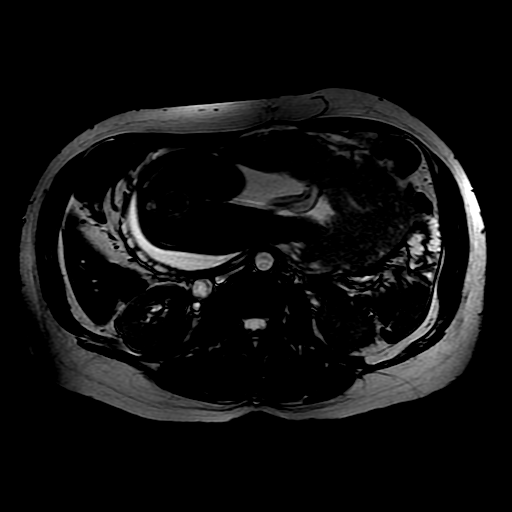
[im 56/75]
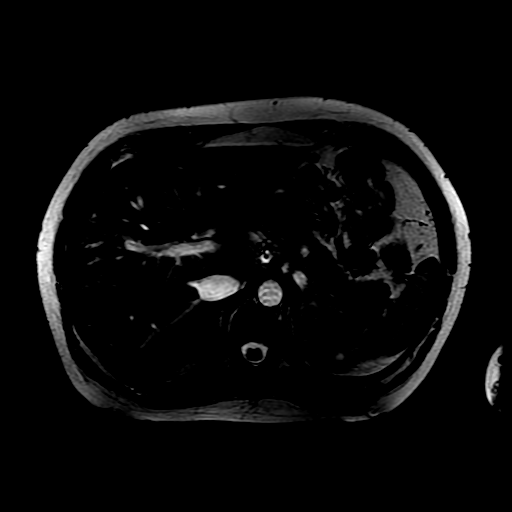
[im 75/75]
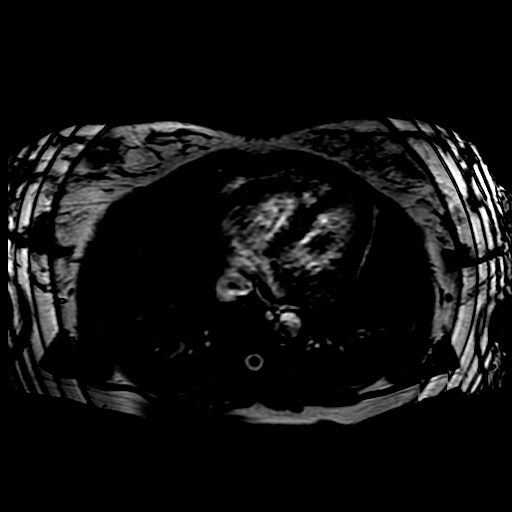

[Series 5: T2 · sagittal · 5.0mm · 0.66mm/px · 4 of 68 slices shown (2 of 2)]
[im 1/68]
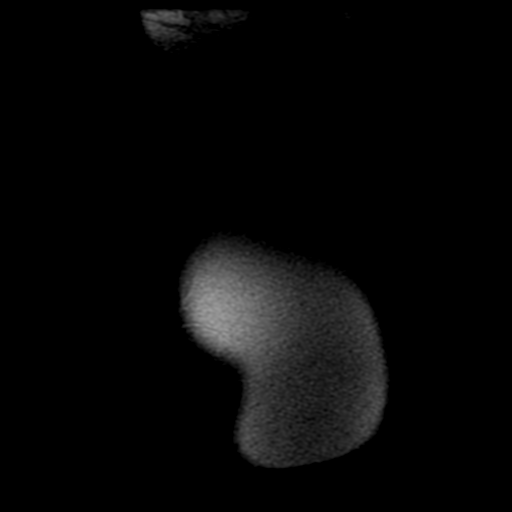
[im 23/68]
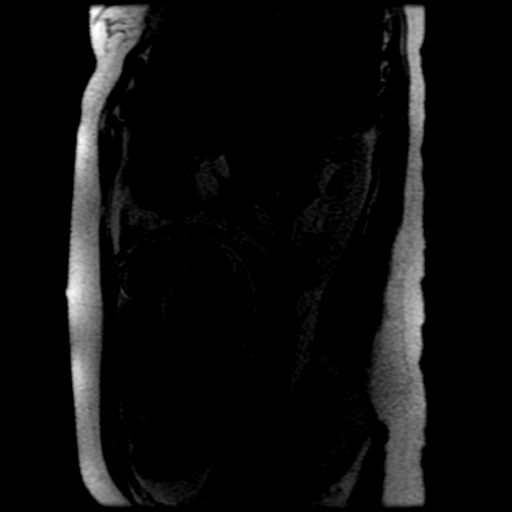
[im 45/68]
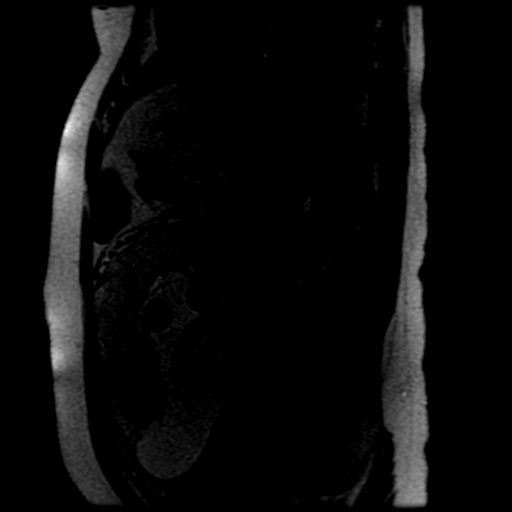
[im 68/68]
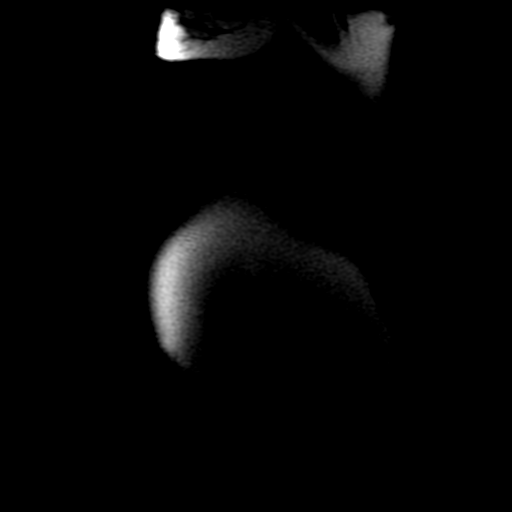

[Series 6: bSSFP · sagittal · 5.0mm · 0.66mm/px · 3 of 70 slices shown (2 of 2)]
[im 1/70]
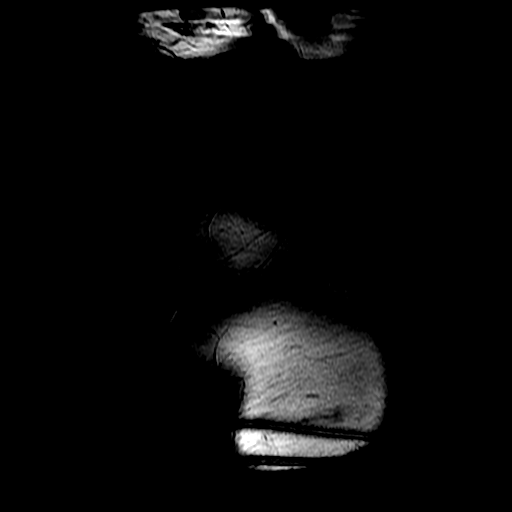
[im 47/70]
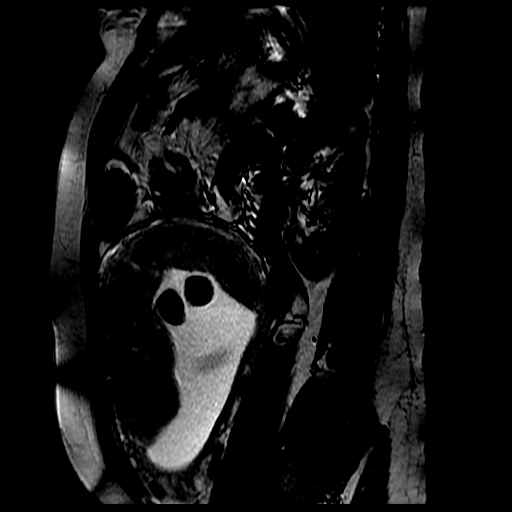
[im 70/70]
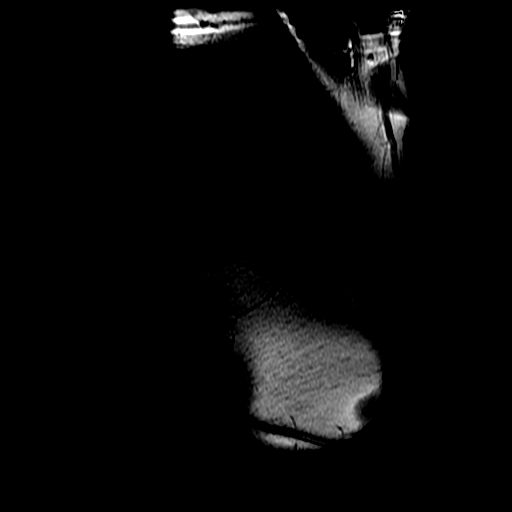

[19 of 48 positions shown; findings below may reference images not displayed]

FINDINGS: Small umbilical hernia containing fat (series 3/ image 62). Trace
fluid. No bowel within the hernia.

Gravid uterus. Placenta is left/ anterior and free of the cervical
os.

Vertex cephalic presentation. Dedicated fetal evaluation was not
performed.

Liver is notable for a 4.2 cm T2 hyperintense lesion in the
posterior right hepatic dome (series 3/ image 11), likely reflecting
a benign hemangioma.

Spleen, pancreas, and adrenal glands are within normal limits.

Gallbladder is unremarkable. No intrahepatic or extrahepatic ductal
dilatation.

8 mm cyst in the posterior left upper kidney (series 3/ image 30).
No hydronephrosis.

Visualized bowel is unremarkable.
IMPRESSION: Small fat containing umbilical hernia with trace fluid. No bowel
within the hernia.

## 2016-09-22 ENCOUNTER — Other Ambulatory Visit: Payer: Self-pay | Admitting: Advanced Practice Midwife

## 2016-11-28 ENCOUNTER — Other Ambulatory Visit: Payer: Self-pay | Admitting: Advanced Practice Midwife

## 2017-01-12 ENCOUNTER — Other Ambulatory Visit: Payer: Self-pay | Admitting: Advanced Practice Midwife

## 2017-04-13 IMAGING — US US ABDOMEN LIMITED
1 series · 14 of 18 positions shown · non-contrast
Comparison: None.

CLINICAL DATA: Umbilical hernia, pain at the belly button for 2
months

EXAM:
LIMITED ABDOMINAL ULTRASOUND

[Series 1: us abdomen limited · 0.12mm/px · 18 acquisitions, 14 frames shown]
[im 1/18]
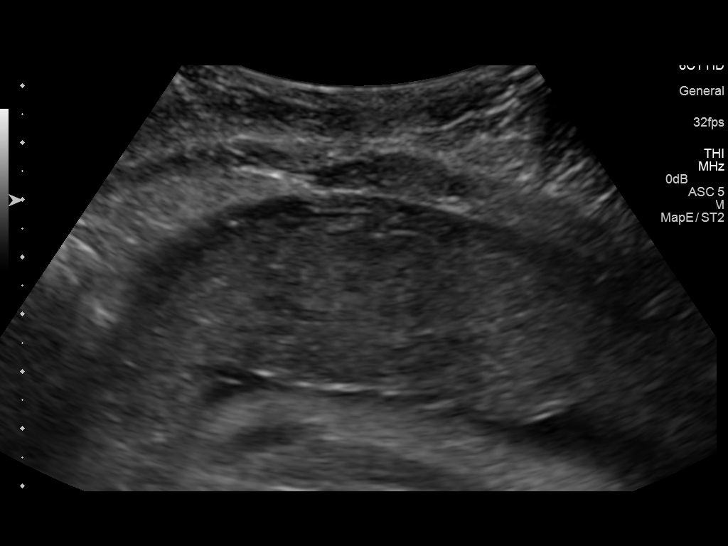
[im 2/18]
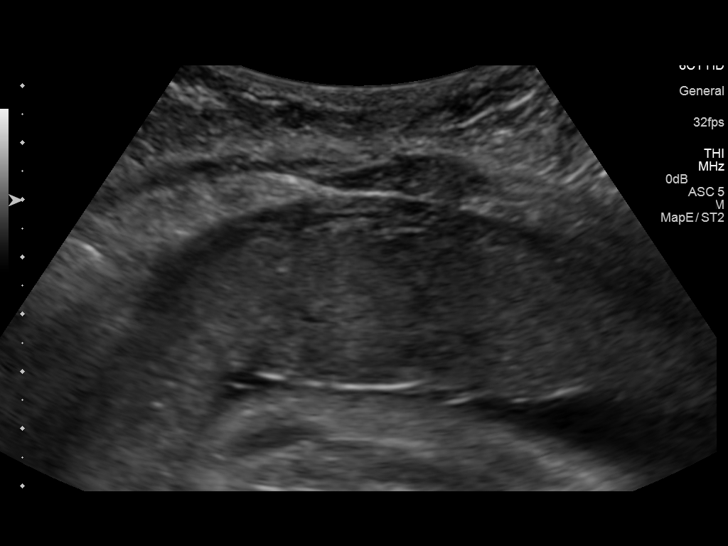
[im 4/18]
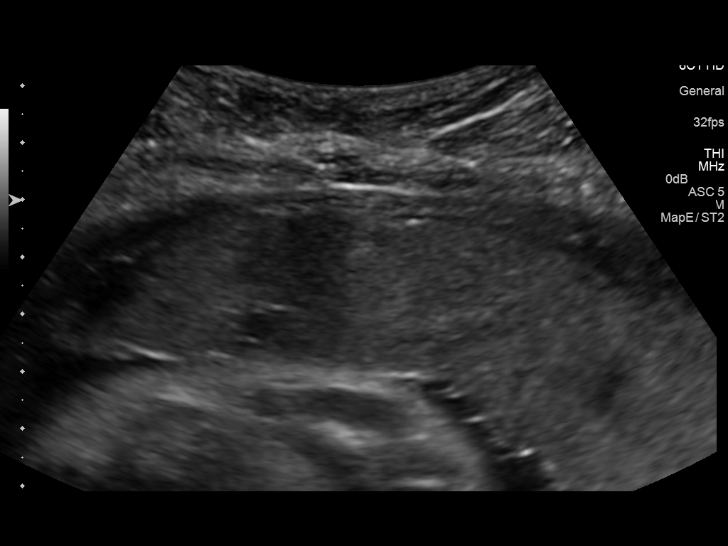
[im 5/18]
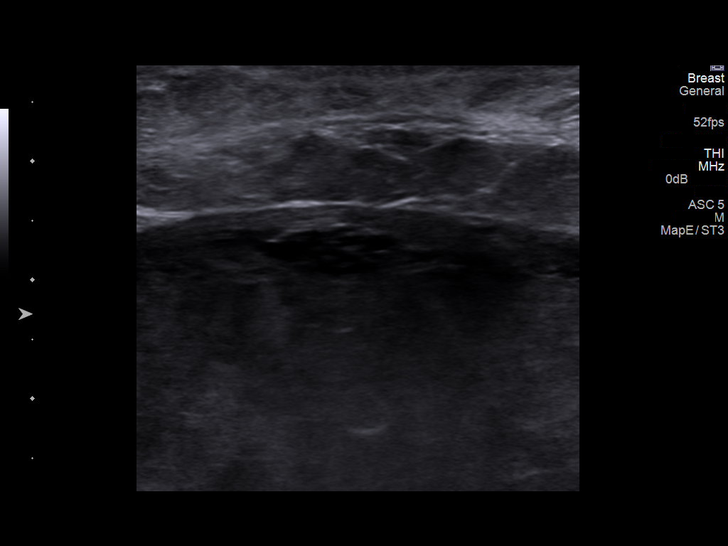
[im 6/18]
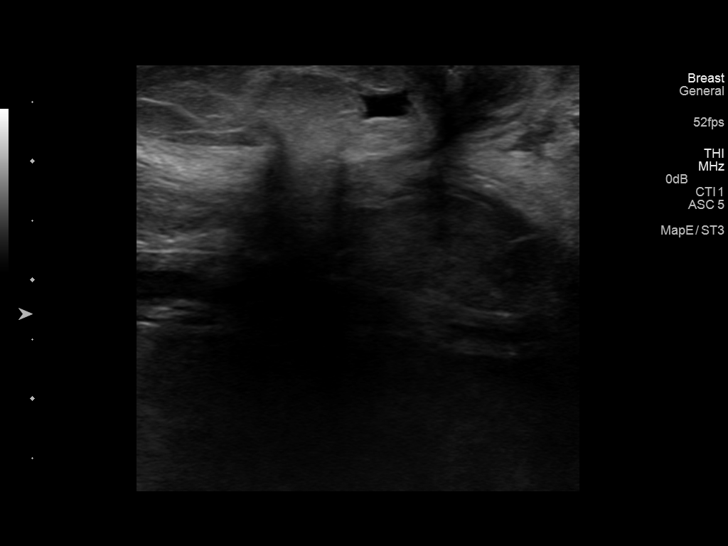
[im 8/18]
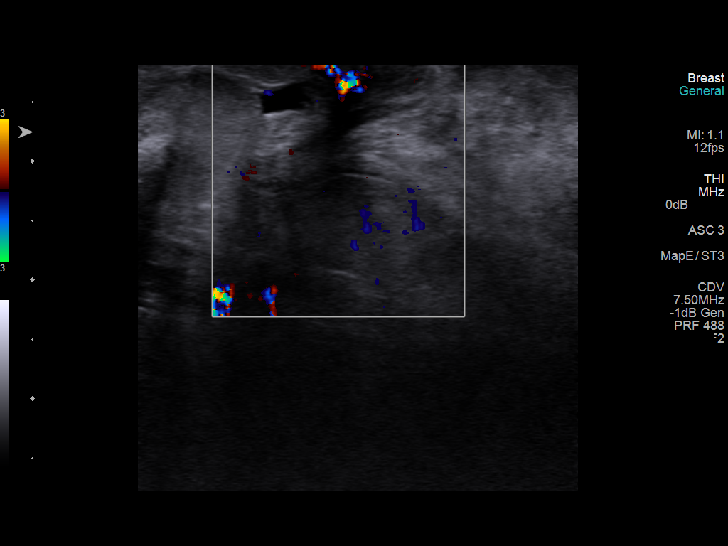
[im 9/18]
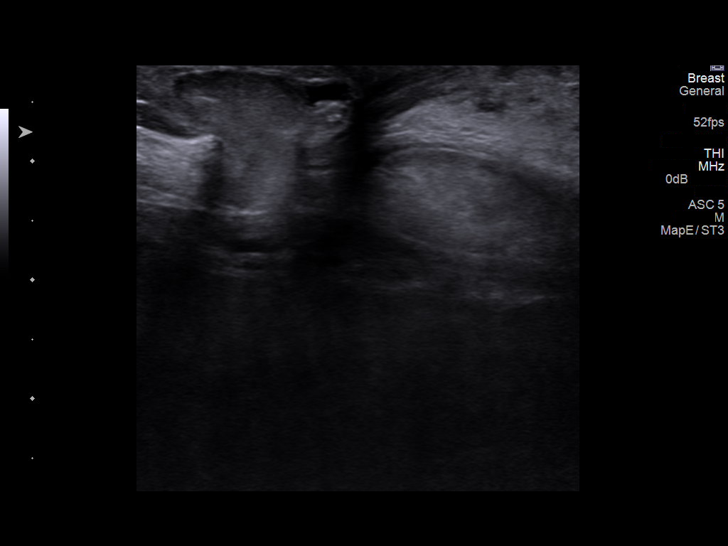
[im 10/18]
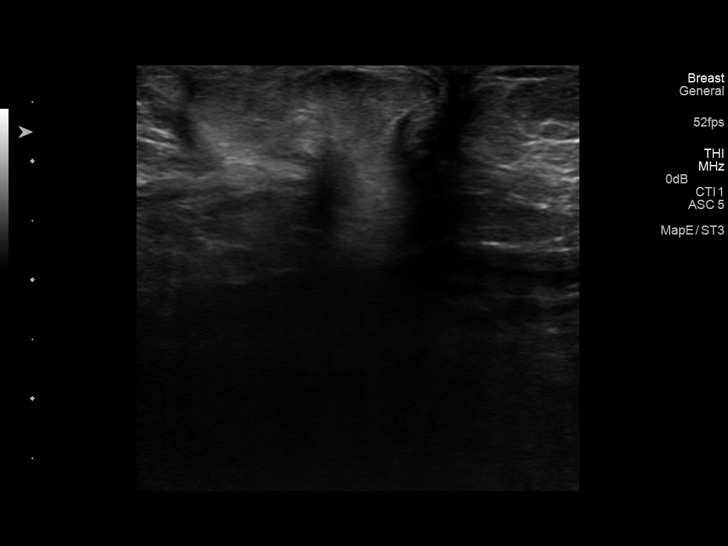
[im 11/18]
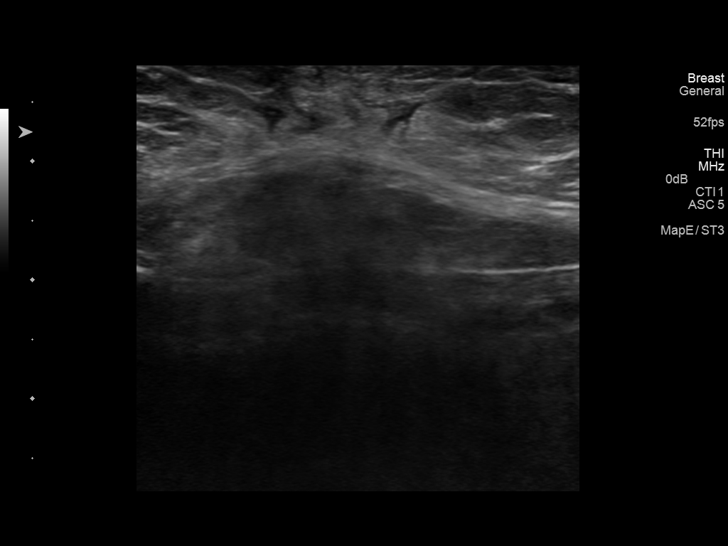
[im 13/18]
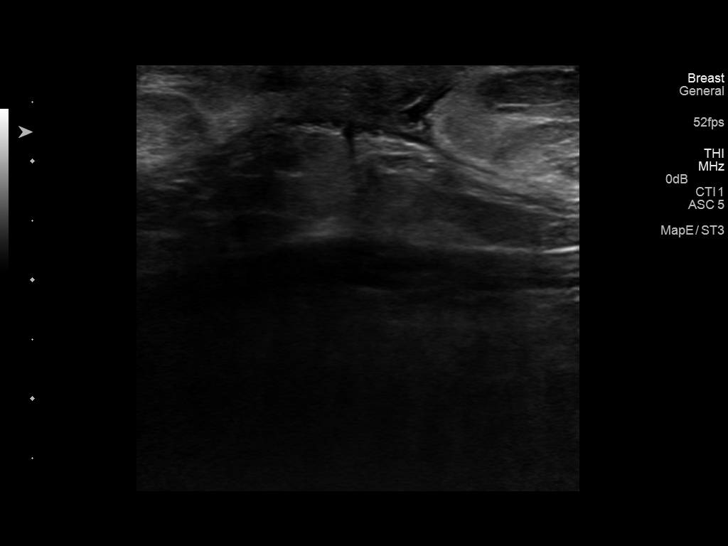
[im 14/18]
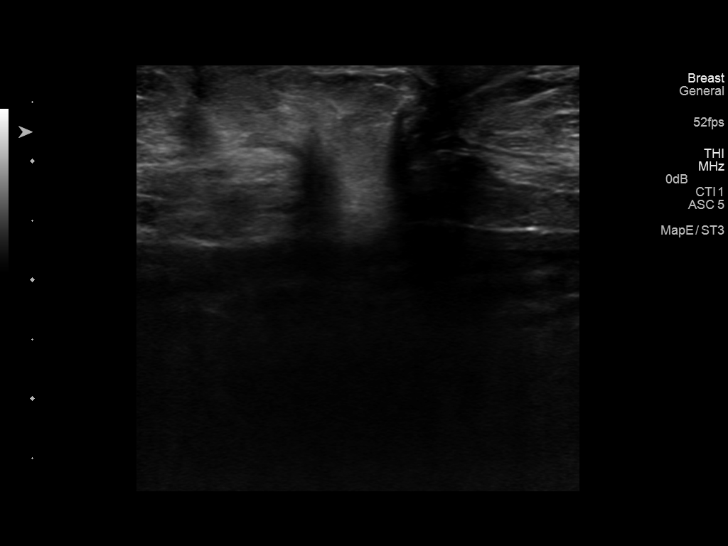
[im 15/18]
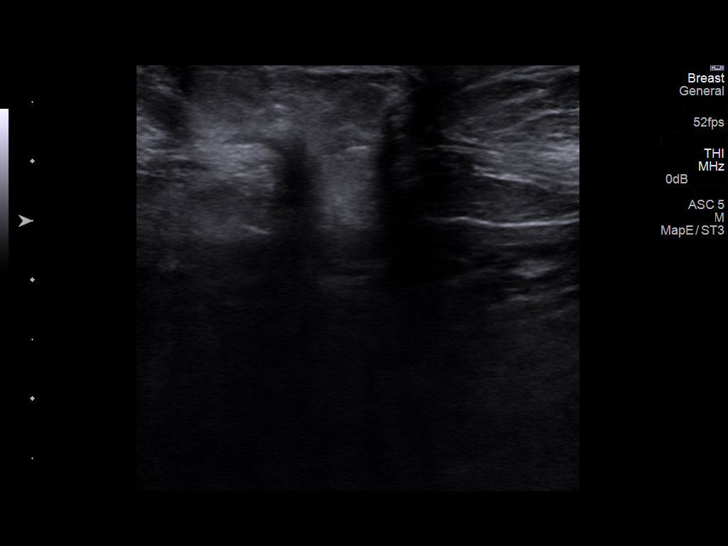
[im 17/18]
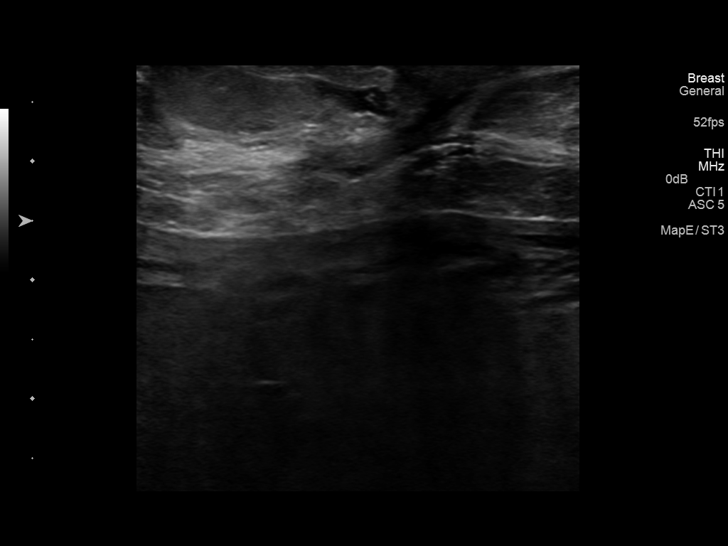
[im 18/18]
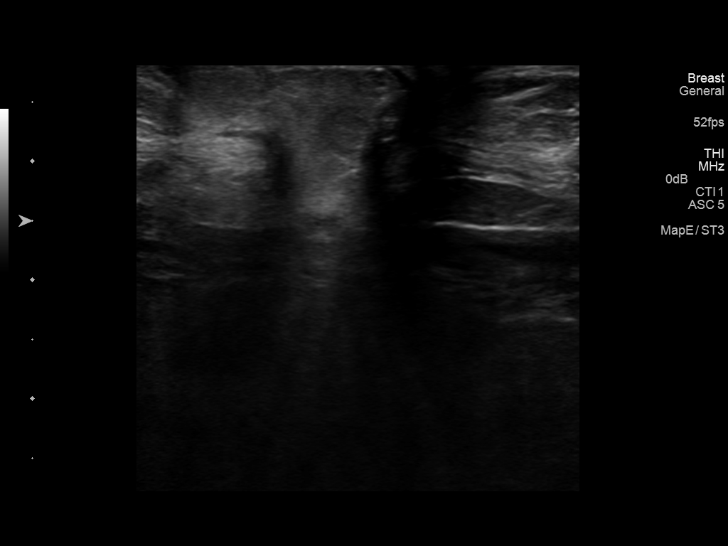

[14 of 18 positions shown; findings below may reference images not displayed]

FINDINGS: Real-time sonography of the periumbilical region is performed. There
is hyperechoic material protruding through a defect in the abdominal
wall most concerning a form an umbilical hernia likely containing
fat and possibly a small amounts of bowel.

There is no other fluid collection or hematoma.
IMPRESSION: Hyperechoic material protruding through a defect in the abdominal
wall most concerning a form an umbilical hernia likely containing
fat and possibly a small amounts of bowel.

## 2017-09-03 DIAGNOSIS — Z6836 Body mass index (BMI) 36.0-36.9, adult: Secondary | ICD-10-CM | POA: Insufficient documentation

## 2019-02-08 ENCOUNTER — Emergency Department (HOSPITAL_COMMUNITY): Payer: Medicaid Other

## 2019-02-08 ENCOUNTER — Other Ambulatory Visit: Payer: Self-pay

## 2019-02-08 ENCOUNTER — Emergency Department (HOSPITAL_COMMUNITY)
Admission: EM | Admit: 2019-02-08 | Discharge: 2019-02-08 | Disposition: A | Discharge status: Home / Self Care | Payer: Medicaid Other | Attending: Emergency Medicine | Admitting: Emergency Medicine

## 2019-02-08 ENCOUNTER — Encounter (HOSPITAL_COMMUNITY): Payer: Self-pay

## 2019-02-08 DIAGNOSIS — R059 Cough, unspecified: Secondary | ICD-10-CM

## 2019-02-08 DIAGNOSIS — G501 Atypical facial pain: Secondary | ICD-10-CM | POA: Insufficient documentation

## 2019-02-08 DIAGNOSIS — Z79899 Other long term (current) drug therapy: Secondary | ICD-10-CM | POA: Insufficient documentation

## 2019-02-08 DIAGNOSIS — F17210 Nicotine dependence, cigarettes, uncomplicated: Secondary | ICD-10-CM | POA: Diagnosis not present

## 2019-02-08 DIAGNOSIS — R600 Localized edema: Secondary | ICD-10-CM | POA: Insufficient documentation

## 2019-02-08 DIAGNOSIS — R05 Cough: Secondary | ICD-10-CM | POA: Insufficient documentation

## 2019-02-08 LAB — I-STAT BETA HCG BLOOD, ED (MC, WL, AP ONLY)

## 2019-02-08 LAB — CBC WITH DIFFERENTIAL/PLATELET
Abs Immature Granulocytes: 0.01 10*3/uL (ref 0.00–0.07)
BASOS PCT: 1 %
Basophils Absolute: 0 10*3/uL (ref 0.0–0.1)
EOS ABS: 0.6 10*3/uL — AB (ref 0.0–0.5)
EOS PCT: 10 %
HEMATOCRIT: 41.7 % (ref 36.0–46.0)
Hemoglobin: 13.6 g/dL (ref 12.0–15.0)
IMMATURE GRANULOCYTES: 0 %
LYMPHS ABS: 2.9 10*3/uL (ref 0.7–4.0)
Lymphocytes Relative: 52 %
MCH: 29.5 pg (ref 26.0–34.0)
MCHC: 32.6 g/dL (ref 30.0–36.0)
MCV: 90.5 fL (ref 80.0–100.0)
MONO ABS: 0.9 10*3/uL (ref 0.1–1.0)
MONOS PCT: 16 %
NEUTROS PCT: 21 %
Neutro Abs: 1.2 10*3/uL — ABNORMAL LOW (ref 1.7–7.7)
PLATELETS: 112 10*3/uL — AB (ref 150–400)
RBC: 4.61 MIL/uL (ref 3.87–5.11)
RDW: 13.8 % (ref 11.5–15.5)
WBC: 5.5 10*3/uL (ref 4.0–10.5)
nRBC: 0 % (ref 0.0–0.2)

## 2019-02-08 LAB — COMPREHENSIVE METABOLIC PANEL
ALT: 112 U/L — AB (ref 0–44)
AST: 66 U/L — ABNORMAL HIGH (ref 15–41)
Albumin: 3.5 g/dL (ref 3.5–5.0)
Alkaline Phosphatase: 38 U/L (ref 38–126)
Anion gap: 7 (ref 5–15)
BUN: 9 mg/dL (ref 6–20)
CO2: 24 mmol/L (ref 22–32)
Calcium: 8.1 mg/dL — ABNORMAL LOW (ref 8.9–10.3)
Chloride: 105 mmol/L (ref 98–111)
Creatinine, Ser: 0.54 mg/dL (ref 0.44–1.00)
GFR calc Af Amer: >60 mL/min (ref >60)
Glucose, Bld: 82 mg/dL (ref 70–99)
Potassium: 3.4 mmol/L — ABNORMAL LOW (ref 3.5–5.1)
Sodium: 136 mmol/L (ref 135–145)
Total Bilirubin: 0.4 mg/dL (ref 0.3–1.2)
Total Protein: 6.3 g/dL — ABNORMAL LOW (ref 6.5–8.1)

## 2019-02-08 MED ORDER — BENZONATATE 100 MG PO CAPS: 200.0000 mg | ORAL_CAPSULE | Freq: Three times a day (TID) | ORAL | 0 refills | Status: AC | PRN

## 2019-02-08 MED ORDER — ONDANSETRON HCL 4 MG/2ML IJ SOLN
4.0000 mg | Freq: Once | INTRAMUSCULAR | Status: AC
  Administered 2019-02-08: 4 mg via INTRAVENOUS
  Filled 2019-02-08: qty 2

## 2019-02-08 MED ORDER — LEVOFLOXACIN 500 MG PO TABS
500.0000 mg | ORAL_TABLET | Freq: Once | ORAL | Status: AC
  Administered 2019-02-08: 500 mg via ORAL
  Filled 2019-02-08: qty 1

## 2019-02-08 MED ORDER — BENZONATATE 100 MG PO CAPS
200.0000 mg | ORAL_CAPSULE | Freq: Once | ORAL | Status: AC
  Administered 2019-02-08: 200 mg via ORAL
  Filled 2019-02-08: qty 2

## 2019-02-08 MED ORDER — HYDROMORPHONE HCL 1 MG/ML IJ SOLN
0.5000 mg | Freq: Once | INTRAMUSCULAR | Status: AC
  Administered 2019-02-08: 0.5 mg via INTRAVENOUS
  Filled 2019-02-08: qty 1

## 2019-02-08 MED ORDER — LEVOFLOXACIN 500 MG PO TABS: 500.0000 mg | ORAL_TABLET | Freq: Every day | ORAL | 0 refills | Status: AC

## 2019-02-08 MED ORDER — SODIUM CHLORIDE 0.9 % IV BOLUS
1000.0000 mL | Freq: Once | INTRAVENOUS | Status: AC
  Administered 2019-02-08: 1000 mL via INTRAVENOUS

## 2019-02-08 MED ORDER — ONDANSETRON 4 MG PO TBDP: ORAL_TABLET | ORAL | 0 refills | Status: AC

## 2019-02-08 MED ORDER — OXYCODONE-ACETAMINOPHEN 5-325 MG PO TABS: 1.0000 | ORAL_TABLET | Freq: Four times a day (QID) | ORAL | 0 refills | Status: AC | PRN

## 2019-02-08 MED ORDER — OXYCODONE-ACETAMINOPHEN 5-325 MG PO TABS
1.0000 | ORAL_TABLET | Freq: Once | ORAL | Status: AC
  Administered 2019-02-08: 1 via ORAL
  Filled 2019-02-08: qty 1

## 2019-02-08 NOTE — Discharge Instructions (Addendum)
Follow-up with your doctor next week for recheck.  Drink plenty of fluids °
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
## 2019-05-20 DIAGNOSIS — F411 Generalized anxiety disorder: Secondary | ICD-10-CM | POA: Insufficient documentation

## 2019-08-16 ENCOUNTER — Other Ambulatory Visit: Payer: Self-pay

## 2019-08-16 DIAGNOSIS — Z20822 Contact with and (suspected) exposure to covid-19: Secondary | ICD-10-CM

## 2019-08-17 LAB — NOVEL CORONAVIRUS, NAA: SARS-CoV-2, NAA: NOT DETECTED

## 2019-08-19 ENCOUNTER — Telehealth: Payer: Self-pay

## 2019-08-19 NOTE — Telephone Encounter (Signed)
Pt aware covid lab test negative, not detected °

## 2019-09-01 DIAGNOSIS — F1190 Opioid use, unspecified, uncomplicated: Secondary | ICD-10-CM | POA: Insufficient documentation

## 2019-09-01 DIAGNOSIS — F1120 Opioid dependence, uncomplicated: Secondary | ICD-10-CM | POA: Insufficient documentation

## 2019-12-06 DIAGNOSIS — T402X5A Adverse effect of other opioids, initial encounter: Secondary | ICD-10-CM | POA: Insufficient documentation

## 2020-01-04 DIAGNOSIS — F17210 Nicotine dependence, cigarettes, uncomplicated: Secondary | ICD-10-CM | POA: Insufficient documentation

## 2020-01-04 DIAGNOSIS — H8110 Benign paroxysmal vertigo, unspecified ear: Secondary | ICD-10-CM | POA: Insufficient documentation

## 2020-10-30 DIAGNOSIS — E282 Polycystic ovarian syndrome: Secondary | ICD-10-CM | POA: Insufficient documentation

## 2021-04-08 DIAGNOSIS — F5101 Primary insomnia: Secondary | ICD-10-CM | POA: Insufficient documentation

## 2021-09-05 DIAGNOSIS — G935 Compression of brain: Secondary | ICD-10-CM | POA: Insufficient documentation

## 2021-11-12 ENCOUNTER — Encounter (INDEPENDENT_AMBULATORY_CARE_PROVIDER_SITE_OTHER): Payer: Self-pay | Admitting: *Deleted

## 2022-03-03 ENCOUNTER — Ambulatory Visit (INDEPENDENT_AMBULATORY_CARE_PROVIDER_SITE_OTHER): Payer: Medicaid Other | Admitting: Gastroenterology

## 2022-04-24 ENCOUNTER — Encounter (INDEPENDENT_AMBULATORY_CARE_PROVIDER_SITE_OTHER): Payer: Self-pay | Admitting: Gastroenterology

## 2022-04-24 ENCOUNTER — Ambulatory Visit (INDEPENDENT_AMBULATORY_CARE_PROVIDER_SITE_OTHER): Payer: Self-pay | Admitting: Gastroenterology

## 2022-04-25 ENCOUNTER — Encounter (INDEPENDENT_AMBULATORY_CARE_PROVIDER_SITE_OTHER): Payer: Self-pay | Admitting: *Deleted

## 2023-01-30 DIAGNOSIS — E782 Mixed hyperlipidemia: Secondary | ICD-10-CM | POA: Insufficient documentation

## 2024-06-22 ENCOUNTER — Ambulatory Visit: Admitting: Neurosurgery

## 2024-06-22 ENCOUNTER — Encounter: Payer: Self-pay | Admitting: Neurosurgery

## 2024-06-22 VITALS — BP 112/80 | Ht 63.0 in | Wt 124.8 lb

## 2024-06-22 DIAGNOSIS — G935 Compression of brain: Secondary | ICD-10-CM | POA: Diagnosis not present

## 2024-06-22 NOTE — Progress Notes (Unsigned)
 43yo lady with a 7mm Chiari who I have treated in the past with Diamox 250mg  bid successfully.  Recently, she started having vertigo, nausea/vomiting, feeling of syncope even at night. This has been requiring her to be flat in bed.  She has a very laborious job and works outside.  Listening to her, it sounds like she has bad dehydration: she doesn't eat or drink all day and urinates very dark at the end of the day only.  We decided that she is going to drink at least 1 gallon a day and eat carbohydrates during the day when working and we will have a VV in 2w.

## 2024-07-08 ENCOUNTER — Inpatient Hospital Stay
Admission: RE | Admit: 2024-07-08 | Discharge: 2024-07-08 | Disposition: A | Discharge status: Home / Self Care | Payer: Self-pay | Source: Ambulatory Visit | Attending: Neurosurgery | Admitting: Neurosurgery

## 2024-07-08 ENCOUNTER — Telehealth: Admitting: Neurosurgery

## 2024-07-08 ENCOUNTER — Other Ambulatory Visit: Payer: Self-pay

## 2024-07-08 DIAGNOSIS — Z049 Encounter for examination and observation for unspecified reason: Secondary | ICD-10-CM

## 2024-07-12 ENCOUNTER — Ambulatory Visit (INDEPENDENT_AMBULATORY_CARE_PROVIDER_SITE_OTHER): Admitting: Neurosurgery

## 2024-07-12 DIAGNOSIS — G935 Compression of brain: Secondary | ICD-10-CM

## 2024-07-12 NOTE — Progress Notes (Signed)
 Virtual Visit via Telephone Note  I connected with Mary Lowe on 07/12/24 at  7:40 AM EDT by telephone and verified that I am speaking with the correct person using two identifiers.  Location: Patient: IN car, at the side of the road. 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  Recently she came to our clinic and was complaining of lightheadedness and headaches.  We felt that at that point her fluid intake as well as caloric intake was very poor and I suggested drinking more water during the day and consuming more carbohydrates.  She shares with me that she has done this and it has been significantly improved her dizziness as she drinks a lot of water during the day and is also taking a more carbohydrates.  However, her headaches that arise at the top of her head as well as her forehead have not changed.  She does not endorse any headaches in the back of her head or radiation into her shoulders.  I shared with her that it would be odd for a Chiari malformation to give this kind of symptomatology but given the fact that her MRIs are several years old, I would like to repeat the MRI of the brain and we will get the cine flow sequences as well to see whether there is absence of flow across the foramen magnum and see her back.  I will get these done at [facility] with the sequences can be performed.    I discussed the limitations, risks, security and privacy concerns of performing an evaluation and management service by telephone and the availability of in person appointments. I also discussed with the patient that there may be a patient responsible charge related to this service. The patient expressed understanding and agreed to proceed.                I discussed the assessment and treatment plan with the patient. The patient was provided an opportunity to ask questions and all were answered. The patient agreed with  the plan and demonstrated an understanding of the instructions.   The patient was advised to call back or seek an in-person evaluation if the symptoms worsen or if the condition fails to improve as anticipated.  I provided 15 minutes of non-face-to-face time during this encounter.   Tylor Courtwright, MD

## 2024-08-08 ENCOUNTER — Ambulatory Visit (HOSPITAL_COMMUNITY)

## 2024-10-21 ENCOUNTER — Ambulatory Visit (HOSPITAL_COMMUNITY)

## 2024-11-14 ENCOUNTER — Ambulatory Visit (HOSPITAL_COMMUNITY): Attending: Neurosurgery
